# Patient Record
Sex: Female | Born: 2000 | Race: White | Hispanic: No | Marital: Single | Smoking: Never smoker
Health system: Southern US, Community
[De-identification: ages and names within clinical notes are randomized; demographics above are authoritative.]

## PROBLEM LIST (undated history)

## (undated) DIAGNOSIS — G8929 Other chronic pain: Secondary | ICD-10-CM

## (undated) DIAGNOSIS — F32A Depression, unspecified: Secondary | ICD-10-CM

## (undated) DIAGNOSIS — N946 Dysmenorrhea, unspecified: Secondary | ICD-10-CM

## (undated) DIAGNOSIS — R41840 Attention and concentration deficit: Secondary | ICD-10-CM

## (undated) DIAGNOSIS — R519 Headache, unspecified: Secondary | ICD-10-CM

## (undated) DIAGNOSIS — K5904 Chronic idiopathic constipation: Secondary | ICD-10-CM

## (undated) HISTORY — PX: NO PAST SURGERIES: SHX2092

## (undated) HISTORY — DX: Dysmenorrhea, unspecified: N94.6

## (undated) HISTORY — DX: Depression, unspecified: F32.A

## (undated) HISTORY — DX: Headache, unspecified: R51.9

## (undated) HISTORY — DX: Other chronic pain: G89.29

---
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
## 2020-02-05 DIAGNOSIS — R109 Unspecified abdominal pain: Secondary | ICD-10-CM | POA: Insufficient documentation

## 2020-02-05 DIAGNOSIS — F4321 Adjustment disorder with depressed mood: Secondary | ICD-10-CM | POA: Insufficient documentation

## 2020-04-09 ENCOUNTER — Other Ambulatory Visit: Payer: Self-pay | Admitting: Orthopedic Surgery

## 2020-04-09 DIAGNOSIS — M2392 Unspecified internal derangement of left knee: Secondary | ICD-10-CM

## 2020-04-17 ENCOUNTER — Ambulatory Visit
Admission: RE | Admit: 2020-04-17 | Discharge: 2020-04-17 | Disposition: A | Discharge status: Home / Self Care | Payer: BC Managed Care – PPO | Source: Ambulatory Visit | Attending: Orthopedic Surgery | Admitting: Orthopedic Surgery

## 2020-04-17 ENCOUNTER — Other Ambulatory Visit: Payer: Self-pay

## 2020-04-17 DIAGNOSIS — M2392 Unspecified internal derangement of left knee: Secondary | ICD-10-CM | POA: Diagnosis not present

## 2020-04-18 ENCOUNTER — Ambulatory Visit: Admission: RE | Admit: 2020-04-18 | Payer: 59 | Source: Ambulatory Visit

## 2020-05-18 ENCOUNTER — Ambulatory Visit (LOCAL_COMMUNITY_HEALTH_CENTER): Payer: BC Managed Care – PPO

## 2020-05-18 ENCOUNTER — Other Ambulatory Visit: Payer: Self-pay

## 2020-05-18 DIAGNOSIS — Z23 Encounter for immunization: Secondary | ICD-10-CM | POA: Diagnosis not present

## 2020-05-18 NOTE — Progress Notes (Signed)
Pt declines Bexsero.

## 2020-08-02 ENCOUNTER — Ambulatory Visit
Admission: EM | Admit: 2020-08-02 | Discharge: 2020-08-02 | Disposition: A | Discharge status: Home / Self Care | Payer: BC Managed Care – PPO | Attending: Family Medicine | Admitting: Family Medicine

## 2020-08-02 ENCOUNTER — Other Ambulatory Visit: Payer: Self-pay

## 2020-08-02 DIAGNOSIS — Z20822 Contact with and (suspected) exposure to covid-19: Secondary | ICD-10-CM | POA: Insufficient documentation

## 2020-08-02 DIAGNOSIS — J069 Acute upper respiratory infection, unspecified: Secondary | ICD-10-CM | POA: Insufficient documentation

## 2020-08-02 DIAGNOSIS — R059 Cough, unspecified: Secondary | ICD-10-CM | POA: Diagnosis present

## 2020-08-02 DIAGNOSIS — R0982 Postnasal drip: Secondary | ICD-10-CM | POA: Insufficient documentation

## 2020-08-02 DIAGNOSIS — Z79899 Other long term (current) drug therapy: Secondary | ICD-10-CM | POA: Insufficient documentation

## 2020-08-02 DIAGNOSIS — J029 Acute pharyngitis, unspecified: Secondary | ICD-10-CM | POA: Insufficient documentation

## 2020-08-02 DIAGNOSIS — R062 Wheezing: Secondary | ICD-10-CM | POA: Diagnosis not present

## 2020-08-02 DIAGNOSIS — R0602 Shortness of breath: Secondary | ICD-10-CM | POA: Insufficient documentation

## 2020-08-02 LAB — SARS CORONAVIRUS 2 (TAT 6-24 HRS): SARS Coronavirus 2: NEGATIVE

## 2020-08-02 MED ORDER — BENZONATATE 100 MG PO CAPS: 200.0000 mg | ORAL_CAPSULE | Freq: Three times a day (TID) | ORAL | 0 refills | Status: DC

## 2020-08-02 MED ORDER — PROMETHAZINE-DM 6.25-15 MG/5ML PO SYRP: 5.0000 mL | ORAL_SOLUTION | Freq: Four times a day (QID) | ORAL | 0 refills | Status: DC | PRN

## 2020-08-02 NOTE — ED Provider Notes (Signed)
[facility]    CSN: [id number] Arrival date & time: 08/02/20  1038      History   Chief Complaint Chief Complaint  Patient presents with   Cough    HPI Vanessa Gonzalez is a 19 y.o. female.   19 year old female here for evaluation of cough, congestion, runny nose with clear discharge, sore throat, headache, shortness of breath, and wheezing x4 days.  She reports that her cough is dry and nonproductive.  She denies fever, changes to sense of taste or smell, nausea, vomiting, diarrhea, or body aches.  She denies any known Covid contacts.  She has been fully vaccinated with the [employer] series for Covid.     Past Medical History:  Diagnosis Date   Painful menstrual periods     There are no problems to display for this patient.   Past Surgical History:  Procedure Laterality Date   NO PAST SURGERIES      OB History    Gravida  0   Para  0   Term  0   Preterm  0   AB  0   Living  0     SAB  0   TAB  0   Ectopic  0   Multiple  0   Live Births  0            Home Medications    Prior to Admission medications   Medication Sig Start Date End Date Taking? Authorizing Provider  FLUoxetine (PROZAC) 20 MG capsule Take 20 mg by mouth daily. 07/09/20  Yes [provider]  medroxyPROGESTERone (DEPO-PROVERA) 150 MG/ML injection Inject 1 mL (150 mg total) into the muscle every 3 (three) months. 12/26/18  Yes Shambley, Melody N, CNM  benzonatate (TESSALON) 100 MG capsule Take 2 capsules (200 mg total) by mouth every 8 (eight) hours. 08/02/20   Becky Augusta, NP  promethazine-dextromethorphan (PROMETHAZINE-DM) 6.25-15 MG/5ML syrup Take 5 mLs by mouth 4 (four) times daily as needed for cough. 08/02/20   Becky Augusta, NP    Family History Family History  Problem Relation Age of Onset   Healthy Mother    Healthy Father    Breast cancer Neg Hx    Ovarian cancer Neg Hx    Colon cancer Neg Hx     Social History Social History    Tobacco Use   Smoking status: Never Smoker   Smokeless tobacco: Never Used  Vaping Use   Vaping Use: Never used  Substance Use Topics   Alcohol use: No   Drug use: No     Allergies   Patient has no known allergies.   Review of Systems Review of Systems  Constitutional: Negative for activity change and appetite change.  HENT: Positive for congestion, postnasal drip, rhinorrhea and sore throat. Negative for ear pain, sinus pressure and sinus pain.   Respiratory: Positive for cough, shortness of breath and wheezing.   Cardiovascular: Negative for chest pain.  Gastrointestinal: Negative for abdominal pain, diarrhea and nausea.  Genitourinary: Negative for dysuria and frequency.  Musculoskeletal: Negative for arthralgias.  Skin: Negative for rash.  Neurological: Positive for headaches.  Psychiatric/Behavioral: Negative.      Physical Exam Triage Vital Signs ED Triage Vitals  Enc Vitals Group     BP 08/02/20 1208 118/70     Pulse Rate 08/02/20 1208 70     Resp 08/02/20 1208 17     Temp 08/02/20 1208 98.1 F (36.7 C)     Temp Source  08/02/20 1208 Oral     SpO2 08/02/20 1208 100 %     Weight 08/02/20 1206 155 lb (70.3 kg)     Height 08/02/20 1206 5\' 2"  (1.575 m)     Head Circumference --      Peak Flow --      Pain Score 08/02/20 1206 8     Pain Loc --      Pain Edu? --      Excl. in GC? --    No data found.  Updated Vital Signs BP 118/70 (BP Location: Right Arm)    Pulse 70    Temp 98.1 F (36.7 C) (Oral)    Resp 17    Ht 5\' 2"  (1.575 m)    Wt 155 lb (70.3 kg)    SpO2 100%    BMI 28.35 kg/m   Visual Acuity Right Eye Distance:   Left Eye Distance:   Bilateral Distance:    Right Eye Near:   Left Eye Near:    Bilateral Near:     Physical Exam Vitals and nursing note reviewed.  Constitutional:      General: She is not in acute distress.    Appearance: Normal appearance. She is normal weight. She is not ill-appearing.  HENT:     Head: Normocephalic  and atraumatic.     Right Ear: Tympanic membrane, ear canal and external ear normal.     Left Ear: Tympanic membrane and external ear normal.     Nose: Congestion and rhinorrhea present.     Comments: Nasal mucosa has erythema and edema with moderate clear nasal discharge.    Mouth/Throat:     Mouth: Mucous membranes are moist.     Pharynx: Oropharynx is clear. Posterior oropharyngeal erythema present. No oropharyngeal exudate.     Comments: Tonsillar pillars are unremarkable without redness or exudate.  Posterior oropharynx demonstrates mild erythema with clear postnasal drip. Eyes:     General: No scleral icterus.    Extraocular Movements: Extraocular movements intact.     Conjunctiva/sclera: Conjunctivae normal.     Pupils: Pupils are equal, round, and reactive to light.  Neck:     Comments: Shotty and tender anterior cervical lymphadenopathy. Cardiovascular:     Rate and Rhythm: Normal rate and regular rhythm.     Pulses: Normal pulses.     Heart sounds: Normal heart sounds. No murmur heard.  No gallop.   Pulmonary:     Effort: Pulmonary effort is normal.     Breath sounds: Normal breath sounds. No wheezing, rhonchi or rales.  Musculoskeletal:        General: No swelling or tenderness. Normal range of motion.     Cervical back: Normal range of motion and neck supple.  Lymphadenopathy:     Cervical: Cervical adenopathy present.  Skin:    General: Skin is warm and dry.     Capillary Refill: Capillary refill takes less than 2 seconds.     Findings: No erythema or rash.  Neurological:     General: No focal deficit present.     Mental Status: She is alert and oriented to person, place, and time.  Psychiatric:        Mood and Affect: Mood normal.        Behavior: Behavior normal.        Judgment: Judgment normal.      UC Treatments / Results  Labs (all labs ordered are listed, but only abnormal results are displayed) Labs Reviewed  SARS CORONAVIRUS 2 (TAT 6-24 HRS)     EKG   Radiology No results found.  Procedures Procedures (including critical care time)  Medications Ordered in UC Medications - No data to display  Initial Impression / Assessment and Plan / UC Course  I have reviewed the triage vital signs and the nursing notes.  Pertinent labs & imaging results that were available during my care of the patient were reviewed by me and considered in my medical decision making (see chart for details).   Patient presents for cough congestion runny nose and headache x4 days.  Her symptoms are consistent with viral URI.  Physical exam reveals edematous and erythematous nasal mucosa with moderate clear discharge.  Bilateral TMs clear, posterior oropharynx is red with clear PND.  Lung sounds clear to auscultation.  Will check for Covid and DC home with supportive care and precautions.  Patient reports that when she has had a cough in the past Tessalon Perles worked for her.  Will prescribe those.   Final Clinical Impressions(s) / UC Diagnoses   Final diagnoses:  Viral URI with cough     Discharge Instructions     Isolate until the results of your Covid test are back.  If the test is positive you will need to quarantine for 10 days from the start of your symptoms.  After 10 days you can break quarantine if your symptoms have improved and you have not had a fever in 24 hours.  Use over-the-counter Tylenol and ibuprofen as needed for fever and pain.  Gargle with warm salt water 2-3 times a day to help with throat soreness and wash away postnasal drip.  Use the Tessalon Perles every 8 hours as needed for cough and the Promethazine DM at bedtime.  Return for new or worsening symptoms.    ED Prescriptions    Medication Sig Dispense Auth. Provider   benzonatate (TESSALON) 100 MG capsule Take 2 capsules (200 mg total) by mouth every 8 (eight) hours. 21 capsule Becky Augusta, NP   promethazine-dextromethorphan (PROMETHAZINE-DM) 6.25-15 MG/5ML  syrup Take 5 mLs by mouth 4 (four) times daily as needed for cough. 118 mL Becky Augusta, NP     PDMP not reviewed this encounter.   Becky Augusta, NP 08/02/20 1247

## 2020-08-02 NOTE — Discharge Instructions (Addendum)
Isolate until the results of your Covid test are back.  If the test is positive you will need to quarantine for 10 days from the start of your symptoms.  After 10 days you can break quarantine if your symptoms have improved and you have not had a fever in 24 hours.  Use over-the-counter Tylenol and ibuprofen as needed for fever and pain.  Gargle with warm salt water 2-3 times a day to help with throat soreness and wash away postnasal drip.  Use the Tessalon Perles every 8 hours as needed for cough and the Promethazine DM at bedtime.  Return for new or worsening symptoms.

## 2020-08-02 NOTE — ED Triage Notes (Signed)
Patient complains of cough, congestion, runny nose, headache x Thursday.

## 2020-09-28 ENCOUNTER — Ambulatory Visit
Admission: RE | Admit: 2020-09-28 | Discharge: 2020-09-28 | Disposition: A | Discharge status: Home / Self Care | Payer: BC Managed Care – PPO | Source: Ambulatory Visit | Attending: Emergency Medicine | Admitting: Emergency Medicine

## 2020-09-28 ENCOUNTER — Other Ambulatory Visit: Payer: Self-pay

## 2020-09-28 VITALS — BP 110/66 | HR 89 | Temp 98.3°F | Resp 18

## 2020-09-28 DIAGNOSIS — J069 Acute upper respiratory infection, unspecified: Secondary | ICD-10-CM | POA: Diagnosis not present

## 2020-09-28 DIAGNOSIS — Z20822 Contact with and (suspected) exposure to covid-19: Secondary | ICD-10-CM | POA: Diagnosis not present

## 2020-09-28 LAB — RESP PANEL BY RT-PCR (FLU A&B, COVID) ARPGX2
Influenza A by PCR: NEGATIVE
Influenza B by PCR: NEGATIVE
SARS Coronavirus 2 by RT PCR: NEGATIVE

## 2020-09-28 NOTE — ED Triage Notes (Signed)
Pt presents with nasal drainage, non productive cough, and generalized body aches X 2 days.

## 2020-09-28 NOTE — ED Provider Notes (Signed)
[facility]    CSN: [id number] Arrival date & time: 09/28/20  0900      History   Chief Complaint Chief Complaint  Patient presents with  . Cough  . URI    HPI Vanessa Gonzalez is a 19 y.o. female.   19 year old female presents with sore throat, nasal congestion, body aches and cough that started 2 days ago. Also having headache and decreased appetite. No distinct fever or GI symptoms. Has taken Excedrin for headache with some relief. No known exposure to COVID 19. Has been vaccinated against COVID. No other chronic health issues. Takes Prozac daily and on DepoProvera every 3 months.   The history is provided by the patient.    Past Medical History:  Diagnosis Date  . Painful menstrual periods     There are no problems to display for this patient.   Past Surgical History:  Procedure Laterality Date  . NO PAST SURGERIES      OB History    Gravida  0   Para  0   Term  0   Preterm  0   AB  0   Living  0     SAB  0   IAB  0   Ectopic  0   Multiple  0   Live Births  0            Home Medications    Prior to Admission medications   Medication Sig Start Date End Date Taking? Authorizing Provider  FLUoxetine (PROZAC) 20 MG capsule Take 20 mg by mouth daily. 07/09/20   [provider]  medroxyPROGESTERone (DEPO-PROVERA) 150 MG/ML injection Inject 1 mL (150 mg total) into the muscle every 3 (three) months. 12/26/18   Purcell Nails, CNM    Family History Family History  Problem Relation Age of Onset  . Healthy Mother   . Healthy Father   . Breast cancer Neg Hx   . Ovarian cancer Neg Hx   . Colon cancer Neg Hx     Social History Social History   Tobacco Use  . Smoking status: Never Smoker  . Smokeless tobacco: Never Used  Vaping Use  . Vaping Use: Never used  Substance Use Topics  . Alcohol use: No  . Drug use: No     Allergies   Patient has no known allergies.   Review of Systems Review of Systems   Constitutional: Positive for appetite change and fatigue. Negative for chills and fever.  HENT: Positive for congestion, postnasal drip, rhinorrhea, sinus pressure and sore throat. Negative for ear discharge, ear pain, facial swelling, mouth sores, nosebleeds, sinus pain and trouble swallowing.   Eyes: Negative for pain, discharge, redness and itching.  Respiratory: Positive for cough. Negative for chest tightness, shortness of breath and wheezing.   Gastrointestinal: Negative for diarrhea, nausea and vomiting.  Musculoskeletal: Positive for arthralgias and myalgias. Negative for neck pain and neck stiffness.  Skin: Negative for color change and rash.  Allergic/Immunologic: Negative for environmental allergies, food allergies and immunocompromised state.  Neurological: Positive for headaches. Negative for dizziness, seizures, syncope, weakness, light-headedness and numbness.  Hematological: Negative for adenopathy. Does not bruise/bleed easily.     Physical Exam Triage Vital Signs ED Triage Vitals  Enc Vitals Group     BP 09/28/20 0918 110/66     Pulse Rate 09/28/20 0918 89     Resp 09/28/20 0918 18     Temp 09/28/20 0918 98.3 F (36.8 C)  Temp Source 09/28/20 0918 Oral     SpO2 09/28/20 0918 98 %     Weight --      Height --      Head Circumference --      Peak Flow --      Pain Score 09/28/20 0917 5     Pain Loc --      Pain Edu? --      Excl. in GC? --    No data found.  Updated Vital Signs BP 110/66 (BP Location: Left Arm)   Pulse 89   Temp 98.3 F (36.8 C) (Oral)   Resp 18   SpO2 98%   Visual Acuity Right Eye Distance:   Left Eye Distance:   Bilateral Distance:    Right Eye Near:   Left Eye Near:    Bilateral Near:     Physical Exam Vitals and nursing note reviewed.  Constitutional:      General: She is awake. She is not in acute distress.    Appearance: She is well-developed and well-groomed.     Comments: She is sitting on the exam table in no  acute distress but appears tired.   HENT:     Head: Normocephalic and atraumatic.     Right Ear: Hearing, ear canal and external ear normal. Tympanic membrane is bulging. Tympanic membrane is not injected or erythematous.     Left Ear: Hearing, ear canal and external ear normal. Tympanic membrane is bulging. Tympanic membrane is not injected or erythematous.     Nose: Congestion present.     Right Sinus: No maxillary sinus tenderness or frontal sinus tenderness.     Left Sinus: No maxillary sinus tenderness or frontal sinus tenderness.     Mouth/Throat:     Lips: Pink.     Mouth: Mucous membranes are moist.     Pharynx: Uvula midline. Posterior oropharyngeal erythema present. No pharyngeal swelling, oropharyngeal exudate or uvula swelling.  Eyes:     Extraocular Movements: Extraocular movements intact.     Conjunctiva/sclera: Conjunctivae normal.  Cardiovascular:     Rate and Rhythm: Normal rate and regular rhythm.     Heart sounds: Normal heart sounds. No murmur heard.   Pulmonary:     Effort: Pulmonary effort is normal. No respiratory distress.     Breath sounds: Normal breath sounds and air entry. No decreased air movement. No decreased breath sounds, wheezing or rhonchi.  Musculoskeletal:     Cervical back: Normal range of motion and neck supple. No rigidity.  Lymphadenopathy:     Cervical: No cervical adenopathy.  Skin:    General: Skin is warm and dry.     Capillary Refill: Capillary refill takes less than 2 seconds.     Findings: No rash.  Neurological:     General: No focal deficit present.     Mental Status: She is alert and oriented to person, place, and time.  Psychiatric:        Mood and Affect: Mood normal.        Behavior: Behavior normal. Behavior is cooperative.        Thought Content: Thought content normal.        Judgment: Judgment normal.      UC Treatments / Results  Labs (all labs ordered are listed, but only abnormal results are displayed) Labs  Reviewed  RESP PANEL BY RT-PCR (FLU A&B, COVID) [id number]    EKG   Radiology No results found.  Procedures Procedures (including critical  care time)  Medications Ordered in UC Medications - No data to display  Initial Impression / Assessment and Plan / UC Course  I have reviewed the triage vital signs and the nursing notes.  Pertinent labs & imaging results that were available during my care of the patient were reviewed by me and considered in my medical decision making (see chart for details).     Reviewed negative rapid COVID 19 test and Influenza test results with patient. Discussed that she probably has a viral upper respiratory illness. May continue Excedrin as needed for headache. May take OTC Dayquil and Nyquil or similar medication for symptom relief. Continue to push fluids to stay well hydrated. Rest. Note written for work. Follow-up in 4 to 5 days if not improving.   Final Clinical Impressions(s) / UC Diagnoses   Final diagnoses:  Viral URI with cough     Discharge Instructions     Recommend continue Excedrin as needed for headache. May take OTC cold and flu medication as needed for symptoms. Rest. Push fluids to help stay hydrated. Follow-up in 4 to 5 days if not improving.     ED Prescriptions    None     PDMP not reviewed this encounter.   Sudie Grumbling, NP 09/28/20 [phone]

## 2020-09-28 NOTE — Discharge Instructions (Addendum)
Recommend continue Excedrin as needed for headache. May take OTC cold and flu medication as needed for symptoms. Rest. Push fluids to help stay hydrated. Follow-up in 4 to 5 days if not improving.

## 2020-10-05 ENCOUNTER — Ambulatory Visit
Admission: EM | Admit: 2020-10-05 | Discharge: 2020-10-05 | Disposition: A | Discharge status: Home / Self Care | Payer: BC Managed Care – PPO | Attending: Family Medicine | Admitting: Family Medicine

## 2020-10-05 ENCOUNTER — Other Ambulatory Visit: Payer: Self-pay

## 2020-10-05 ENCOUNTER — Encounter: Payer: Self-pay | Admitting: Emergency Medicine

## 2020-10-05 DIAGNOSIS — R42 Dizziness and giddiness: Secondary | ICD-10-CM

## 2020-10-05 MED ORDER — MECLIZINE HCL 25 MG PO TABS: 25.0000 mg | ORAL_TABLET | Freq: Three times a day (TID) | ORAL | 0 refills | Status: DC | PRN

## 2020-10-05 NOTE — ED Triage Notes (Signed)
Pt c/o dizziness. She states she feels like the room is spinning and is worse when she changes positions. Started this morning. She denies nausea or vomiting.

## 2020-10-05 NOTE — ED Provider Notes (Signed)
[facility]    CSN: [id number] Arrival date & time: 10/05/20  1038      History   Chief Complaint Chief Complaint  Patient presents with   Dizziness   HPI  19 year old female presents with dizziness.  Started abruptly this morning.  Started while she was in bed.  States that she feels like the room is spinning.  She denies any associated nausea or vomiting.  Worse with abrupt head movements.  She states that she does not feel well enough to drive or well enough to work today.  No relieving factors.  No other associated symptoms.  No other complaints.  Past Medical History:  Diagnosis Date   Painful menstrual periods    Past Surgical History:  Procedure Laterality Date   NO PAST SURGERIES      OB History    Gravida  0   Para  0   Term  0   Preterm  0   AB  0   Living  0     SAB  0   IAB  0   Ectopic  0   Multiple  0   Live Births  0            Home Medications    Prior to Admission medications   Medication Sig Start Date End Date Taking? Authorizing Provider  FLUoxetine (PROZAC) 20 MG capsule Take 20 mg by mouth daily. 07/09/20  Yes [provider]  medroxyPROGESTERone (DEPO-PROVERA) 150 MG/ML injection Inject 1 mL (150 mg total) into the muscle every 3 (three) months. 12/26/18  Yes Shambley, Melody N, CNM  meclizine (ANTIVERT) 25 MG tablet Take 1 tablet (25 mg total) by mouth 3 (three) times daily as needed for dizziness. 10/05/20   Tommie Sams, DO    Family History Family History  Problem Relation Age of Onset   Healthy Mother    Healthy Father    Breast cancer Neg Hx    Ovarian cancer Neg Hx    Colon cancer Neg Hx     Social History Social History   Tobacco Use   Smoking status: Never Smoker   Smokeless tobacco: Never Used  Vaping Use   Vaping Use: Never used  Substance Use Topics   Alcohol use: No   Drug use: No     Allergies   Patient has no known allergies.   Review of  Systems Review of Systems  Gastrointestinal: Negative for nausea and vomiting.  Neurological: Positive for dizziness.   Physical Exam Triage Vital Signs ED Triage Vitals  Enc Vitals Group     BP 10/05/20 1217 (!) 134/93     Pulse Rate 10/05/20 1217 78     Resp 10/05/20 1217 18     Temp 10/05/20 1217 98.4 F (36.9 C)     Temp Source 10/05/20 1217 Oral     SpO2 10/05/20 1217 100 %     Weight 10/05/20 1216 154 lb 15.7 oz (70.3 kg)     Height 10/05/20 1216 5\' 2"  (1.575 m)     Head Circumference --      Peak Flow --      Pain Score 10/05/20 1216 0     Pain Loc --      Pain Edu? --      Excl. in GC? --    No data found.  Updated Vital Signs BP (!) 134/93 (BP Location: Right Arm)    Pulse 78    Temp 98.4 F (  36.9 C) (Oral)    Resp 18    Ht 5\' 2"  (1.575 m)    Wt 70.3 kg    SpO2 100%    BMI 28.35 kg/m   Visual Acuity Right Eye Distance:   Left Eye Distance:   Bilateral Distance:    Right Eye Near:   Left Eye Near:    Bilateral Near:     Physical Exam Constitutional:      General: She is not in acute distress.    Appearance: Normal appearance. She is not ill-appearing.  HENT:     Head: Normocephalic and atraumatic.  Eyes:     Comments: Nystagmus noted with left lateral gaze.  Cardiovascular:     Rate and Rhythm: Normal rate and regular rhythm.     Heart sounds: No murmur heard.   Pulmonary:     Effort: Pulmonary effort is normal.     Breath sounds: Normal breath sounds. No wheezing, rhonchi or rales.  Neurological:     Mental Status: She is alert.  Psychiatric:        Mood and Affect: Mood normal.        Behavior: Behavior normal.    UC Treatments / Results  Labs (all labs ordered are listed, but only abnormal results are displayed) Labs Reviewed - No data to display  EKG   Radiology No results found.  Procedures Procedures (including critical care time)  Medications Ordered in UC Medications - No data to display  Initial Impression / Assessment  and Plan / UC Course  I have reviewed the triage vital signs and the nursing notes.  Pertinent labs & imaging results that were available during my care of the patient were reviewed by me and considered in my medical decision making (see chart for details).    19 year old female presents with BPPV.  Treating with meclizine.  Final Clinical Impressions(s) / UC Diagnoses   Final diagnoses:  Vertigo   Discharge Instructions   None    ED Prescriptions    Medication Sig Dispense Auth. Provider   meclizine (ANTIVERT) 25 MG tablet Take 1 tablet (25 mg total) by mouth 3 (three) times daily as needed for dizziness. 30 tablet 12, DO     PDMP not reviewed this encounter.   Tommie Sams, Tommie Sams 10/05/20 1338

## 2020-10-20 ENCOUNTER — Ambulatory Visit
Admission: EM | Admit: 2020-10-20 | Discharge: 2020-10-20 | Disposition: A | Discharge status: Home / Self Care | Payer: BC Managed Care – PPO | Attending: Sports Medicine | Admitting: Sports Medicine

## 2020-10-20 ENCOUNTER — Other Ambulatory Visit: Payer: Self-pay

## 2020-10-20 ENCOUNTER — Encounter: Payer: Self-pay | Admitting: Emergency Medicine

## 2020-10-20 DIAGNOSIS — Z20822 Contact with and (suspected) exposure to covid-19: Secondary | ICD-10-CM | POA: Insufficient documentation

## 2020-10-20 LAB — SARS CORONAVIRUS 2 (TAT 6-24 HRS): SARS Coronavirus 2: NEGATIVE

## 2020-10-20 NOTE — Discharge Instructions (Signed)

## 2020-10-20 NOTE — ED Triage Notes (Signed)
Patient here for COVID test only, no symptoms.  

## 2021-01-26 ENCOUNTER — Other Ambulatory Visit: Payer: Self-pay

## 2021-01-26 ENCOUNTER — Ambulatory Visit (INDEPENDENT_AMBULATORY_CARE_PROVIDER_SITE_OTHER): Payer: BC Managed Care – PPO

## 2021-01-26 ENCOUNTER — Ambulatory Visit
Admission: EM | Admit: 2021-01-26 | Discharge: 2021-01-26 | Disposition: A | Discharge status: Home / Self Care | Payer: BC Managed Care – PPO | Attending: Emergency Medicine | Admitting: Emergency Medicine

## 2021-01-26 DIAGNOSIS — S99911A Unspecified injury of right ankle, initial encounter: Secondary | ICD-10-CM | POA: Diagnosis not present

## 2021-01-26 DIAGNOSIS — S99921A Unspecified injury of right foot, initial encounter: Secondary | ICD-10-CM

## 2021-01-26 DIAGNOSIS — S93401A Sprain of unspecified ligament of right ankle, initial encounter: Secondary | ICD-10-CM

## 2021-01-26 DIAGNOSIS — X501XXA Overexertion from prolonged static or awkward postures, initial encounter: Secondary | ICD-10-CM | POA: Diagnosis not present

## 2021-01-26 DIAGNOSIS — S93601A Unspecified sprain of right foot, initial encounter: Secondary | ICD-10-CM

## 2021-01-26 MED ORDER — IBUPROFEN 600 MG PO TABS: 600.0000 mg | ORAL_TABLET | Freq: Four times a day (QID) | ORAL | 0 refills | Status: DC | PRN

## 2021-01-26 NOTE — ED Triage Notes (Signed)
Patient states that she rolled her ankle getting off bleachers today. States that her foot rolled under her. Reports that pain is worse with weight bearing. States that this occurred around 11am.

## 2021-01-26 NOTE — ED Provider Notes (Signed)
HPI  SUBJECTIVE:  Vanessa Gonzalez is a 20 y.o. female who presents with throbbing constant right ankle and foot pain after injuring it earlier today.  She states that she was pushing off the bleachers to stand up, was stepping down onto her right foot, and then her right ankle rolled outward.  She felt a pop and then fell to a sitting position onto her ankle and foot.  States that she has difficulty moving her toes, ande is unable to move her ankle due to pain.  She reports numbness and tingling, swelling laterally.  She was unable to weight-bear immediately after.  She is unable to weight-bear now.  No bruising.  She tried ice, elevation, and compressing her distal tibia and fibula together with improvement in her symptoms.  Symptoms worse with trying to weight-bear, movement.  Past medical history negative for right ankle injury.  LMP: Now.  Denies the possibility of being pregnant.  PMD: Dr. Judithann Sheen.  Orthopedics: None    Past Medical History:  Diagnosis Date  . Painful menstrual periods     Past Surgical History:  Procedure Laterality Date  . NO PAST SURGERIES      Family History  Problem Relation Age of Onset  . Healthy Mother   . Healthy Father   . Breast cancer Neg Hx   . Ovarian cancer Neg Hx   . Colon cancer Neg Hx     Social History   Tobacco Use  . Smoking status: Never Smoker  . Smokeless tobacco: Never Used  Vaping Use  . Vaping Use: Never used  Substance Use Topics  . Alcohol use: No  . Drug use: No    No current facility-administered medications for this encounter.  Current Outpatient Medications:  .  FLUoxetine (PROZAC) 20 MG capsule, Take 20 mg by mouth daily., Disp: , Rfl:  .  ibuprofen (ADVIL) 600 MG tablet, Take 1 tablet (600 mg total) by mouth every 6 (six) hours as needed., Disp: 30 tablet, Rfl: 0 .  medroxyPROGESTERone (DEPO-PROVERA) 150 MG/ML injection, Inject 1 mL (150 mg total) into the muscle every 3 (three) months., Disp: 1 mL, Rfl: 4  No Known  Allergies   ROS  As noted in HPI.   Physical Exam  BP (!) 126/91 (BP Location: Left Arm)   Pulse 85   Temp 98.1 F (36.7 C) (Oral)   Resp 18   Ht 5\' 2"  (1.575 m)   Wt 70.3 kg   SpO2 100%   BMI 28.35 kg/m   Constitutional: Well developed, well nourished, no acute distress Eyes:  EOMI, conjunctiva normal bilaterally HENT: Normocephalic, atraumatic,mucus membranes moist Respiratory: Normal inspiratory effort Cardiovascular: Normal rate GI: nondistended skin: No rash, skin intact Musculoskeletal: R  Ankle Tenderness entire joint, Proximal fibula NT , Distal fibula  tender, Medial malleolus tender,  Deltoid ligaments medially  tender,  ATFL  tender, calcaneofibular ligament tender, posterior tablofibular ligament tender ,  Achilles NT, calcaneus NT,  Proximal 5th metatarsal tender, Midfoot tender with mild soft tissue swelling lateral foot immediately below ATFL, distal NVI with baseline sensation / motor to foot with DP 2+. Pain with dorsiflexion/plantar flexion.  pain with inversion/eversion. No bruising. - squeeze test  Ant drawer test stable . Pt not able to bear weight in dept.  Neurologic: Alert & oriented x 3, no focal neuro deficits Psychiatric: Speech and behavior appropriate   ED Course   Medications - No data to display  Orders Placed This Encounter  Procedures  .  DG Ankle Complete Right    Standing Status:   Standing    Number of Occurrences:   1    Order Specific Question:   Reason for Exam (SYMPTOM  OR DIAGNOSIS REQUIRED)    Answer:   s/p fall with rolled motion, pain and swelling  . DG Foot Complete Right    Standing Status:   Standing    Number of Occurrences:   1    Order Specific Question:   Reason for Exam (SYMPTOM  OR DIAGNOSIS REQUIRED)    Answer:   s/p fall with rolled motion, pain and swelling  . Apply ASO ankle    Standing Status:   Standing    Number of Occurrences:   1    Order Specific Question:   Laterality    Answer:   Right  . Crutches     Standing Status:   Standing    Number of Occurrences:   1    No results found for this or any previous visit (from the past 24 hour(s)). DG Ankle Complete Right  Result Date: 01/26/2021 CLINICAL DATA:  Fall with rolling injury EXAM: RIGHT ANKLE - COMPLETE 3+ VIEW COMPARISON:  None. FINDINGS: Frontal, oblique, and lateral views were obtained. No evident fracture or joint effusion. No appreciable joint space narrowing or erosion. Ankle mortise appears intact. IMPRESSION: No fracture or evident arthropathy.  Ankle mortise appears intact. Electronically Signed   By: Bretta Bang III M.D.   On: 01/26/2021 16:14   DG Foot Complete Right  Result Date: 01/26/2021 CLINICAL DATA:  Fall with rolling type injury EXAM: RIGHT FOOT COMPLETE - 3+ VIEW COMPARISON:  None. FINDINGS: Frontal, oblique, and lateral views were obtained. No fracture or dislocation. Joint spaces appear normal. No erosive change. IMPRESSION: No fracture or dislocation.  No appreciable arthropathy. Electronically Signed   By: Bretta Bang III M.D.   On: 01/26/2021 16:15    ED Clinical Impression  1. Sprain of right ankle, unspecified ligament, initial encounter   2. Sprain of right foot, initial encounter      ED Assessment/Plan  [place] Narcotic database reviewed for this patient, and feel that the risk/benefit ratio today is favorable for proceeding with a prescription for controlled substance.  Reviewed imaging independently.  Ankle mortise appears intact.  No fracture, dislocation of the foot or ankle.  Normal foot, ankle. see radiology report for full details.  Patient with a right ankle and foot sprain.  Will place in an ASO, crutches, Tylenol/ibuprofen, ice, elevate above heart is much as possible.  Will write school note for 2 days and work note for 2 days.  Discussed that she may return to work on Saturday, but that she will need to wear the ASO ankle use the crutches.  Follow-up with Dr. Allena Katz at [facility] or  [facility] ASAP.  Discussed  imaging, MDM, treatment plan, and plan for follow-up with patient. patient agrees with plan.   Meds ordered this encounter  Medications  . ibuprofen (ADVIL) 600 MG tablet    Sig: Take 1 tablet (600 mg total) by mouth every 6 (six) hours as needed.    Dispense:  30 tablet    Refill:  0      *This clinic note was created using Scientist, clinical (histocompatibility and immunogenetics). Therefore, there may be occasional mistakes despite careful proofreading.  ?    Domenick Gong, MD 01/26/21 1800

## 2021-01-26 NOTE — Discharge Instructions (Addendum)
600 mg of ibuprofen combined with 1000 mg of Tylenol together 3-4 times a day as needed for pain.  Ice for 10 to 15 minutes for the first 3 days.  You may do this multiple times a day.  Keep your foot and ankle elevated above your heart is much as possible over the next several days.  Wear the ASO and use the crutches.  Please follow-up with orthopedics as soon as he can.  He may need physical therapy.

## 2021-08-05 ENCOUNTER — Other Ambulatory Visit: Payer: Self-pay

## 2021-08-05 ENCOUNTER — Encounter: Payer: Self-pay | Admitting: Psychiatry

## 2021-08-05 ENCOUNTER — Ambulatory Visit (INDEPENDENT_AMBULATORY_CARE_PROVIDER_SITE_OTHER): Payer: BC Managed Care – PPO | Admitting: Psychiatry

## 2021-08-05 VITALS — BP 132/86 | HR 84 | Temp 98.8°F | Wt 172.0 lb

## 2021-08-05 DIAGNOSIS — R41840 Attention and concentration deficit: Secondary | ICD-10-CM | POA: Diagnosis not present

## 2021-08-05 DIAGNOSIS — F39 Unspecified mood [affective] disorder: Secondary | ICD-10-CM | POA: Insufficient documentation

## 2021-08-05 NOTE — Progress Notes (Signed)
Psychiatric Initial Adult Assessment   Patient Identification: Vanessa Gonzalez MRN:  [id number] Date of Evaluation:  08/05/2021 Referral Source: Aram Beecham MD Chief Complaint:   Chief Complaint   Establish Care; ADD    Visit Diagnosis:    ICD-10-CM   1. Episodic mood disorder (HCC)  F39     2. Attention and concentration deficit  R41.840       History of Present Illness:  Vanessa Gonzalez is a 20 year old Caucasian female, employed at [employer], high school graduate, single, lives in [place], has a history of mood swings, attention and focus problems, was evaluated in office today , patient presented to establish care ,was referred by her primary care provider.  Patient as well as mother participated in the evaluation.  Patient today reports she has been struggling with mood swings like irritability, anger issues as well as episodes of being happy since the past several years.  She reports she has been this way all her life.  She reports she could be happy and smiling one minute and the next minute she could snap and get angry.  She reports when she was in pre-k through elementary she would act out in school, got into fights, throwing chairs, being hyperactive.  She however reports that changed in middle school and high school when she would only have irritable mood swings however she was able to manage it better.  She reports she had problems with learning, she was held back in second grade by her mother due to her learning problems.  She may have had testing done in school, she does not believe she was diagnosed with ADHD.  Patient reports concentration and attention problems, forgetting appointments and obligations, inability to keep up with projects especially boring once, easy distractibility, does not pay attention when someone speaks to her directly, making careless mistakes.  She reports she is interested in going to college at [employer] soon.  She is interested in getting  tested for ADHD.  Patient denies any anxiety or depressive symptoms other than excessive sleepiness during weekends.  Patient completed mood disorder questionnaire and does report episodes when she is more self-confident, easy distractibility, having more energy however did not score high.  She could not give any clear history of manic or hypomanic symptoms.  Patient denies any suicidality, homicidality or perceptual disturbances.  Patient denies any substance abuse problems.  She has good support system from her family, lives with her mother.  She is currently in a relationship with her boyfriend, they just started dating.  Per collateral information from mother-patient has had mood problems, personality issues ever since childhood.  She currently has problems at work, interpersonal problems.  She however has been able to hold her job.  Patient always believes other people are causing problems even when she is the one who may have started it.  Mother believes she likely has learning disability or ADHD and is interested in testing.    Associated Signs/Symptoms: Depression Symptoms:  hypersomnia, difficulty concentrating, loss of energy/fatigue, (Hypo) Manic Symptoms:  Irritable Mood, Anxiety Symptoms:   Denies Psychotic Symptoms:   Denies PTSD Symptoms: Negative  Past Psychiatric History: Patient was diagnosed with depression and anxiety in the past and was started on medications like Prozac-by her OB/GYN, later on changed to venlafaxine, most recently started on Lamictal by her primary care provider.  Patient however is not compliant on any of these medications.  She does not believe any of these medications helped.  Denies inpatient mental health admissions.  Denies suicide attempts.  Previous Psychotropic Medications: Yes as noted above  Substance Abuse History in the last 12 months:  No.  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 Past Medical History:  Diagnosis Date   Chronic headaches    Depression    Painful menstrual periods     Past Surgical History:  Procedure Laterality Date   NO PAST SURGERIES      Family Psychiatric History: As noted below  Family History:  Family History  Problem Relation Age of Onset   Healthy Mother    Mental illness Mother    Bipolar disorder Father    Healthy Father    Breast cancer Neg Hx    Ovarian cancer Neg Hx    Colon cancer Neg Hx     Social History:   Social History   Socioeconomic History   Marital status: Single    Spouse name: Not on file   Number of children: Not on file   Years of education: Not on file   Highest education level: Not on file  Occupational History   Not on file  Tobacco Use   Smoking status: Never   Smokeless tobacco: Never  Vaping Use   Vaping Use: Never used  Substance and Sexual Activity   Alcohol use: No   Drug use: No   Sexual activity: Not Currently    Birth control/protection: Injection  Other Topics Concern   Not on file  Social History Narrative   Works full time for [employer]   Social Determinants of Health   Financial Resource Strain: Not on file  Food Insecurity: Not on file  Transportation Needs: Not on file  Physical Activity: Not on file  Stress: Not on file  Social Connections: Not on file    Additional Social History: Patient was raised by both parents until the age of 71.  Her parents divorced.  She currently lives with her mother and her stepfather.  She currently lives in [place].  Patient has an older brother, younger stepsister.  She has a good relationship with them.  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  Patient has started dating and reports relationship is going okay at this time.  Denies legal problems.  Denies any history of trauma.  Allergies:  No Known  Allergies  Metabolic Disorder Labs: No results found for: HGBA1C, MPG No results found for: PROLACTIN No results found for: CHOL, TRIG, HDL, CHOLHDL, VLDL, LDLCALC No results found for: TSH  Therapeutic Level Labs: No results found for: LITHIUM No results found for: CBMZ No results found for: VALPROATE  Current Medications: Current Outpatient Medications  Medication Sig Dispense Refill   norgestimate-ethinyl estradiol (ORTHO-CYCLEN) 0.25-35 MG-MCG tablet Take 1 tablet by mouth daily.     FLUoxetine (PROZAC) 20 MG capsule Take 20 mg by mouth daily. (Patient not taking: Reported on 08/05/2021)     ibuprofen (ADVIL) 600 MG tablet Take 1 tablet (600 mg total) by mouth every 6 (six) hours as needed. (Patient not taking: Reported on 08/05/2021) 30 tablet 0   lamoTRIgine (LAMICTAL) 150 MG tablet Take 1 tablet by mouth 2 (two) times daily. (Patient not taking: Reported on 08/05/2021)     medroxyPROGESTERone (DEPO-PROVERA) 150 MG/ML injection Inject 1 mL (150 mg total) into the muscle every 3 (three) months. (Patient not taking: Reported on 08/05/2021) 1 mL 4  No current facility-administered medications for this visit.    Musculoskeletal: Strength & Muscle Tone: within normal limits Gait & Station: normal Patient leans: N/A  Psychiatric Specialty Exam: Review of Systems  Neurological:  Positive for headaches (chronic).  Psychiatric/Behavioral:  Positive for decreased concentration.        Mood swings on and off  All other systems reviewed and are negative.  Blood pressure 132/86, pulse 84, temperature 98.8 F (37.1 C), temperature source Temporal, weight 172 lb 0.2 oz (78 kg).Body mass index is 31.46 kg/m.  General Appearance: Casual  Eye Contact:  Fair  Speech:  Clear and Coherent  Volume:  Normal  Mood:   euthymic - but has episodes of being irritable  Affect:  Congruent  Thought Process:  Goal Directed and Descriptions of Associations: Intact  Orientation:  Full (Time,  Place, and Person)  Thought Content:  Logical  Suicidal Thoughts:  No  Homicidal Thoughts:  No  Memory:  Immediate;   Fair Recent;   Fair Remote;   Fair  Judgement:  Fair  Insight:  Fair  Psychomotor Activity:  Normal  Concentration:  Concentration: Fair and Attention Span: Fair  Recall:  [employer] of Knowledge:Fair  Language: Fair  Akathisia:  No  Handed:  Ambidextrous  AIMS (if indicated):  not done  Assets:  Communication Skills Desire for Improvement Housing Intimacy Social Support Talents/Skills Transportation Vocational/Educational  ADL's:  Intact  Cognition: WNL  Sleep:   excessive on weekends   Screenings: GAD-7    Flowsheet Row Office Visit from 08/05/2021 in [facility]  Total GAD-7 Score 2      PHQ2-9    Flowsheet Row Office Visit from 08/05/2021 in [facility]  PHQ-2 Total Score 0  PHQ-9 Total Score 8      Flowsheet Row Office Visit from 08/05/2021 in [facility] ED from 01/26/2021 in [facility] [place]   C-SSRS RISK CATEGORY No Risk No Risk       Assessment and Plan: Vanessa Gonzalez is a 20 year old Caucasian female, single, employed, graduated high school, lives with her mother and stepfather in [place], has a history of mood swings, attention and focus problems was evaluated in office today.  Patient is biologically predisposed given family history of mental health problems.  Patient completed mood disorder questionnaire, scored low on the same, she also does not appear to be clinically depressed and does not meet criteria for an anxiety disorder at this time.  Patient however does report developmental delays-talking-speech as well as attention and focus problems and hyperactivity growing up.  She will benefit from neuropsychological testing. The patient demonstrates the following risk factors for suicide: Chronic risk factors for suicide include:  N/A. Acute risk factors for suicide include:  interpersonal problems . Protective factors for this patient include: positive social support, coping skills, and life satisfaction. Considering these factors, the overall suicide risk at this point appears to be low. Patient is appropriate for outpatient follow up.  Plan Episodic mood disorder-unstable Will refer patient for CBT.  Provided information for insight solution. I have also communicated with staff here to schedule patient with our therapist. Patient was prescribed lamotrigine by primary care provider, she has been noncompliant.  Could restart it if she is interested.  Attention and concentration deficit-unspecified-unstable Will refer for neuropsychological testing-will refer to Dr. Kieth Brightly Completed ASRS -screening. Collateral information obtained from mother. Patient did have hyperactivity and attention problems, developmental delay in  talking/speech.  I have reviewed labs-TSH-1.472-within normal limits-dated 07/12/2021  Follow-up in clinic as needed.  This note was generated in part or whole with voice recognition software. Voice recognition is usually quite accurate but there are transcription errors that can and very often do occur. I apologize for any typographical errors that were not detected and corrected.      Jomarie Longs, MD 10/21/202212:03 PM

## 2021-08-05 NOTE — Patient Instructions (Signed)
CALL [phone] FOR THERAPY   EMAIL - [email or web address]

## 2021-08-15 ENCOUNTER — Encounter: Payer: Self-pay | Admitting: Psychology

## 2021-09-12 ENCOUNTER — Ambulatory Visit: Payer: BC Managed Care – PPO | Admitting: Licensed Clinical Social Worker

## 2021-09-12 ENCOUNTER — Other Ambulatory Visit: Payer: Self-pay

## 2021-09-14 ENCOUNTER — Ambulatory Visit (INDEPENDENT_AMBULATORY_CARE_PROVIDER_SITE_OTHER): Payer: 59 | Admitting: Licensed Clinical Social Worker

## 2021-09-14 ENCOUNTER — Other Ambulatory Visit: Payer: Self-pay

## 2021-09-14 DIAGNOSIS — F39 Unspecified mood [affective] disorder: Secondary | ICD-10-CM

## 2021-09-14 NOTE — Progress Notes (Signed)
Comprehensive Clinical Assessment (CCA) Note  09/14/2021 Vanessa Gonzalez [id number]  Chief Complaint:  Chief Complaint  Patient presents with   Establish Care   Stress   Visit Diagnosis: Episodic Mood Disorder   CCA Screening, Triage and Referral (STR)  Patient Reported Information How did you hear about [place]? Other (Comment)  Referral name: Dr. Shea Evans  Referral phone number: No data recorded  Whom do you see for routine medical problems? No data recorded Practice/Facility Name: No data recorded Practice/Facility Phone Number: No data recorded Name of Contact: No data recorded Contact Number: No data recorded Contact Fax Number: No data recorded Prescriber Name: No data recorded Prescriber Address (if known): No data recorded  What Is the Reason for Your Visit/Call Today? Vanessa Gonzalez is a 20 yo female presenting to [facility] for establishment of counseling services. Pt reports that her mom feels she has needed counseling for quite some time. Pt reports that she had some explosive outbursts as a child, and has episodic mood swings currently. Pt report that most of her stress comes from not getting along with one particular colleague. Pt reporting no SI, HI, or AVH. Pt denies any substance use.  How Long Has This Been Causing You Problems? > than 6 months  What Do You Feel Would Help You the Most Today? Treatment for Depression or other mood problem   Have You Recently Been in Any Inpatient Treatment (Hospital/Detox/Crisis Center/28-Day Program)? No  Name/Location of Program/Hospital:No data recorded How Long Were You There? No data recorded When Were You Discharged? No data recorded  Have You Ever Received Services From [facility] Before? Yes  Who Do You See at [facility]? No data recorded  Have You Recently Had Any Thoughts About Hurting Yourself? No  Are You Planning to Commit Suicide/Harm Yourself At This time? No   Have you Recently Had Thoughts About [place]?  No  Explanation: No data recorded  Have You Used Any Alcohol or Drugs in the Past 24 Hours? No  How Long Ago Did You Use Drugs or Alcohol? No data recorded What Did You Use and How Much? No data recorded  Do You Currently Have a Therapist/Psychiatrist? Yes  Name of Therapist/Psychiatrist: Dr. Shea Evans   Have You Been Recently Discharged From Any Office Practice or Programs? No  Explanation of Discharge From Practice/Program: No data recorded    CCA Screening Triage Referral Assessment Type of Contact: Face-to-Face  Is this Initial or Reassessment? No data recorded Date Telepsych consult ordered in [facility]:  No data recorded Time Telepsych consult ordered in [facility]:  No data recorded  Patient Reported Information Reviewed? No data recorded Patient Left Without Being Seen? No data recorded Reason for Not Completing Assessment: No data recorded  Collateral Involvement: n/a   Does Patient Have a Court Appointed Legal Guardian? No data recorded Name and Contact of Legal Guardian: No data recorded If Minor and Not Living with Parent(s), Who has Custody? No data recorded Is CPS involved or ever been involved? Never  Is APS involved or ever been involved? Never   Patient Determined To Be At Risk for Harm To Self or Others Based on Review of Patient Reported Information or Presenting Complaint? No  Method: No data recorded Availability of Means: No data recorded Intent: No data recorded Notification Required: No data recorded Additional Information for Danger to Others Potential: No data recorded Additional Comments for Danger to Others Potential: No data recorded Are There Guns or Other Weapons in Your Home? No  data recorded Types of Guns/Weapons: No data recorded Are These Weapons Safely Secured?                            No data recorded Who Could Verify You Are Able To Have These Secured: No data recorded Do You Have any Outstanding Charges, Pending Court Dates,  Parole/Probation? No data recorded Contacted To Inform of Risk of Harm To Self or Others: No data recorded  Location of Assessment: Other (comment) ([facility])   Does Patient Present under Involuntary Commitment? No  IVC Papers Initial File Date: No data recorded  [place] of Residence: [place]   Patient Currently Receiving the Following Services: Medication Management   Determination of Need: Routine (7 days)   Options For Referral: Outpatient Therapy; Medication Management     CCA Biopsychosocial Intake/Chief Complaint:  No data recorded Current Symptoms/Problems: anxiety; mood swings   Patient Reported Schizophrenia/Schizoaffective Diagnosis in Past: No   Strengths: pt has good levels of self awareness and self reflection skills  Preferences: outpatient psychiatric supports  Abilities: No data recorded  Type of Services Patient Feels are Needed: medication management; counseling   Initial Clinical Notes/Concerns: Pt reports that she is mostly noncompliant with medication   Mental Health Symptoms Depression:   Sleep (too much or little); Fatigue; Difficulty Concentrating; Irritability   Duration of Depressive symptoms:  Greater than two weeks   Mania:   Racing thoughts; Irritability   Anxiety:    Irritability; Difficulty concentrating; Fatigue; Restlessness; Sleep; Tension   Psychosis:   None   Duration of Psychotic symptoms: No data recorded  Trauma:   None   Obsessions:   None   Compulsions:   None   Inattention:   Poor follow-through on tasks; Symptoms before age 85   Hyperactivity/Impulsivity:   None   Oppositional/Defiant Behaviors:   Argumentative ("more in the past than now")   Emotional Irregularity:   Mood lability   Other Mood/Personality Symptoms:  No data recorded   Mental Status Exam Appearance and self-care  Stature:   Average   Weight:   Average weight   Clothing:   Neat/clean   Grooming:   Normal   Cosmetic  use:   Age appropriate   Posture/gait:   Normal   Motor activity:   Not Remarkable   Sensorium  Attention:   Normal   Concentration:   Normal   Orientation:   X5   Recall/memory:   Normal   Affect and Mood  Affect:   Appropriate   Mood:   Anxious   Relating  Eye contact:   Normal   Facial expression:   Responsive   Attitude toward examiner:   Cooperative   Thought and Language  Speech flow:  Clear and Coherent   Thought content:   Appropriate to Mood and Circumstances   Preoccupation:   None   Hallucinations:   None   Organization:  No data recorded  [employer] of Knowledge:   Good   Intelligence:   Average   Abstraction:   Normal   Judgement:   Good   Reality Testing:   Adequate   Insight:   Good   Decision Making:   Normal   Social Functioning  Social Maturity:   Responsible   Social Judgement:   Normal   Stress  Stressors:   Grief/losses; Work   Coping Ability:   Normal   Skill Deficits:   None   Supports:  Family     Religion: Religion/Spirituality Are You A Religious Person?: No  Leisure/Recreation: Leisure / Recreation Do You Have Hobbies?: Yes Leisure and Hobbies: spending time with boyfriend, shopping, hunting, used to enjoy softball prior to physical injury  Exercise/Diet: Exercise/Diet Do You Exercise?: Yes What Type of Exercise Do You Do?: Run/Walk How Many Times a Week Do You Exercise?: 1-3 times a week Have You Gained or Lost A Significant Amount of Weight in the Past Six Months?: No Do You Follow a Special Diet?: No Do You Have Any Trouble Sleeping?: Yes Explanation of Sleeping Difficulties: "sometimes i sleep too much on the weekends"   CCA Employment/Education Employment/Work Situation: Employment / Work Situation Employment Situation: Employed Where is Patient Currently Employed?: [employer] Satisfied With Your Job?: Yes Do You Work More  Than One Job?: No Work Stressors: "I can't get along with 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?: No  Education: Education Is Patient Currently Attending School?: No Last Grade Completed: 12 Did Garment/textile technologist From [employer]?: Yes Did Theme park manager?: No Did You Attend Graduate School?: No Did You Have Any Difficulty At [employer]?: Yes (pt reports some learning difficulties) Were Any Medications Ever Prescribed For These Difficulties?: No Patient's Education Has Been Impacted by Current Illness: No   CCA Family/Childhood History Family and Relationship History: Family history Does patient have children?: No  Childhood History:  Childhood History By whom was/is the patient raised?: Both parents Additional childhood history information: Parents divorced--pt went back and forth Description of patient's relationship with caregiver when they were a child: stable relationship Patient's description of current relationship with people who raised him/her: stable relationship How were you disciplined when you got in trouble as a child/adolescent?: fairly Does patient have siblings?: Yes Number of Siblings: 2 Description of patient's current relationship with siblings: older brother and younger stepsister. Does not get along with stepsister and gets along with brother when he's around Did patient suffer any verbal/emotional/physical/sexual abuse as a child?: No Did patient suffer from severe childhood neglect?: No Has patient ever been sexually abused/assaulted/raped as an adolescent or adult?: No Was the patient ever a victim of a crime or a disaster?: No Witnessed domestic violence?: No Has patient been affected by domestic violence as an adult?: No  Child/Adolescent Assessment:     CCA Substance Use Alcohol/Drug Use: Alcohol / Drug Use Pain Medications: see MAR Prescriptions: see MAR Over the  Counter: see MAR History of alcohol / drug use?: No history of alcohol / drug abuse     ASAM's:  Six Dimensions of Multidimensional Assessment  Dimension 1:  Acute Intoxication and/or Withdrawal Potential:      Dimension 2:  Biomedical Conditions and Complications:      Dimension 3:  Emotional, Behavioral, or Cognitive Conditions and Complications:     Dimension 4:  Readiness to Change:     Dimension 5:  Relapse, Continued use, or Continued Problem Potential:     Dimension 6:  Recovery/Living Environment:     ASAM Severity Score:    ASAM Recommended Level of Treatment:     Substance use Disorder (SUD)    Recommendations for Services/Supports/Treatments: Recommendations for Services/Supports/Treatments Recommendations For Services/Supports/Treatments: Individual Therapy, Medication Management  DSM5 Diagnoses: Patient Active Problem List   Diagnosis Date Noted   Episodic mood disorder (HCC) 08/05/2021   Attention and concentration deficit 08/05/2021   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

## 2021-09-14 NOTE — Plan of Care (Signed)
Developed tx plan w/ pt input 

## 2021-10-26 ENCOUNTER — Other Ambulatory Visit: Payer: Self-pay

## 2021-10-26 ENCOUNTER — Encounter: Payer: Self-pay | Admitting: Licensed Clinical Social Worker

## 2021-10-26 ENCOUNTER — Ambulatory Visit (INDEPENDENT_AMBULATORY_CARE_PROVIDER_SITE_OTHER): Payer: BC Managed Care – PPO | Admitting: Licensed Clinical Social Worker

## 2021-10-26 DIAGNOSIS — F39 Unspecified mood [affective] disorder: Secondary | ICD-10-CM

## 2021-10-26 NOTE — Progress Notes (Signed)
° °  THERAPIST PROGRESS NOTE  Session Time: 9-945a  Participation Level: Active  Behavioral Response: NeatAlertAnxious  Type of Therapy: Individual Therapy  Treatment Goals addressed:  Problem: Anxiety Disorder CCP Problem  1 Reduce overall frequency, intensity, and duration of the anxiety so that daily functioning is not impaired.   Goal: LTG: Patient will score less than 5 on the Generalized Anxiety Disorder 7 Scale (GAD-7) Outcome: Not Progressing Note: Pt has some increased anxiety after recent job loss  Goal: STG: Patient will participate in at least 80% of scheduled individual psychotherapy sessions Outcome: Progressing  Interventions:  Intervention: Encourage self-care activities Intervention: Encourage family support Intervention: Work with patient to identify the major components of a recent episode of anxiety: physical symptoms, major thoughts and images, and major behaviors they experienced Intervention: Assist with coping skills and behavior   Summary: Vanessa Gonzalez is a 21 y.o. female who presents with symptoms consistent with mood disorder. Pt reports that overall mood has been stable since last session. Pt reports good quality and quantity of sleep with situational insomnia. Pt reports that she is not taking any medication to manage mood.  Allowed pt to explore and express thoughts and feelings associated with recent life situations and external stressors. One major life stressor that has happened since last session is that pt was terminated from her job. Pt states that she called in sick and her supervisor informed her that she would "have to let you go". Pt denies that attendance was an issue in the past--only issue at work was with another employee.  Pt is very unclear as to why she was terminated and has mixed feelings about it. Pt feels well supported by her boyfriend and his family members along with her mother and grandmother. Pt states that she is actively looking  for another job. Offered pt unconditional positive support.  Reviewed coping skills to help manage depression symptoms and anxiety symptoms. Pt reflects understanding.  Continued recommendations are as follows: self care behaviors, positive social engagements, focusing on overall work/home/life balance, and focusing on positive physical and emotional wellness.   Suicidal/Homicidal: No  Therapist Response: Pt is continuing to apply interventions learned in session into daily life situations. Pt is currently on track to meet goals utilizing interventions mentioned above. Personal growth and progress noted. Treatment to continue as indicated.   Plan: Return again in 4 weeks.  Diagnosis: Axis I: Mood Disorder NOS    Axis II: No diagnosis    Vanessa Haber Zailyn Thoennes, LCSW 10/26/2021

## 2021-10-26 NOTE — Plan of Care (Signed)
°  Problem: Anxiety Disorder CCP Problem  1 Reduce overall frequency, intensity, and duration of the anxiety so that daily functioning is not impaired.  Goal: LTG: Patient will score less than 5 on the Generalized Anxiety Disorder 7 Scale (GAD-7) Outcome: Not Progressing Note: Pt has some increased anxiety after recent job loss Goal: STG: Patient will participate in at least 80% of scheduled individual psychotherapy sessions Outcome: Progressing Intervention: Encourage self-care activities Intervention: Encourage family support Intervention: Work with patient to identify the major components of a recent episode of anxiety: physical symptoms, major thoughts and images, and major behaviors they experienced Intervention: Assist with coping skills and behavior

## 2021-11-02 ENCOUNTER — Encounter: Payer: Self-pay | Admitting: Licensed Clinical Social Worker

## 2021-11-30 ENCOUNTER — Ambulatory Visit: Payer: BC Managed Care – PPO | Admitting: Licensed Clinical Social Worker

## 2022-03-02 ENCOUNTER — Encounter: Payer: 59 | Attending: Psychology | Admitting: Psychology

## 2022-03-02 DIAGNOSIS — F39 Unspecified mood [affective] disorder: Secondary | ICD-10-CM | POA: Diagnosis not present

## 2022-03-02 DIAGNOSIS — R41840 Attention and concentration deficit: Secondary | ICD-10-CM | POA: Diagnosis not present

## 2022-03-02 NOTE — Progress Notes (Signed)
Neuropsychological Consultation   Patient:   Vanessa Gonzalez   DOB:   2000-12-11  MR Number:  [id number]  Location:  [facility]ICINE [facility]ILITATION [address] [place], [address] [id number] [facility] [place] [address] Dept: [phone]           Date of Service:   03/02/2022  Start Time:   8 AM End Time:   10 AM  Today's visit was an in person visit conducted in my outpatient clinic office.  The patient, her mother and myself were present.  1 hour and 15 minutes was spent in face-to-face clinical interview and the other 45 minutes was spent with records review, report writing and setting up testing protocols.  Provider/Observer:  Arley Phenix, Psy.D.       Clinical Neuropsychologist       Billing Code/Service: 96116/96121  Chief Complaint:    Vanessa Gonzalez is a 21 year old female referred for neuropsychological evaluation by her treating psychiatrist Jomarie Longs, MD for assistance in differential diagnosis.  The patient has a long history of behavioral difficulties and impulse control issues starting in early childhood with symptoms of depression, agitation and anger and acute mood shifts as well as issues with sleep patterns.  The patient has had acute onset changes in mood.  Difficulties with maintaining attention and focus, uncontrollable mood swings and difficulty sitting still including being very fidgety and impulsive are all noted.  The symptoms go back to early childhood and even preschool.  Reason for Service:  Vanessa Gonzalez is a 21 year old female referred for neuropsychological evaluation by her treating psychiatrist Jomarie Longs, MD for assistance in differential diagnosis.  The patient has a long history of behavioral difficulties and impulse control issues starting in early childhood with symptoms of depression, agitation and anger and acute mood shifts as well as issues with sleep patterns.  The  patient has had acute onset changes in mood.  Difficulties with maintaining attention and focus, uncontrollable mood swings and difficulty sitting still including being very fidgety and impulsive are all noted.  The symptoms go back to early childhood and even preschool.  The patient has a limited past medical history that includes chronic headaches, previous diagnosis of depression and episodic mood disorder, pain associated with menstrual periods and attention and concentration deficits.  The patient and her mother both report that there were significant issues with behavioral difficulties as a child.  The patient would abruptly become very angry when she was a child and would have instances where she would kick adults that she was frustrated with an instance where she threw a chair.  The patient is described as being able to have her mood change "on a dime."  The patient's mother reports that the patient could be in a very good mood and be "good his goal" for others and then if someone would say 1 thing or do something that the patient was frustrated by the patient could flip immediately and get out of control with her behaviors.  This occurred even in daycare.  This sudden mood disturbance was very stressful for the patient's mother.  The patient would regularly fight any type of discipline efforts.  The patient was described as foundation only being very sweet and caring person particularly with others.  The patient was described as having difficulty seeing why her behaviors were wrong.  Even now if the patient does not like what is said the patient's tone suddenly  changes and she will become very snappy and "hateful."  The patient is described as having more sleeping issues in the winter with these issues related to excessive sleep.  The patient reports that she can become very sleepy during the day even though she gets adequate sleep at night.  The patient has been tried on a number of psychotropic  medications in the past.  These have included fluoxetine as well as attempts of stabilization of mood with Lamictal.  However, the patient was never very compliant with her medications.  The patient's mother reports that she can tell a big difference in the patient's behavior and mood when she was taking fluoxetine and felt that it helped her and there was improved behavior when she would take it but the patient would not take it.  When asked of the patient why she was hesitant to taking medications she could not give specifics but did admit to feeling embarrassed that she had to take medicines.  The patient and her mother denies any history of concussive events or loss of consciousness and denies any seizures tremors or other neurological symptoms.  While the patient's mother acknowledges that she has been diagnosed with obsessive-compulsive disorder the patient does not display any symptoms of OCD.  The patient reports that while she feels compulsive to do something at all times and will be very fidgety particularly with her hands it is not described as consistent with obsessive-compulsive or anxiety types of drives.  The patient's mother reports that there have been times such as a break-up with the boyfriend or other stressors whether patient will send very negative text messages about the patient herself.  She will often be very critical of herself and this is worried people such as her past boyfriend for fear that the patient may be harmful to herself.  The patient denies ever having suicidal thoughts or impulse to harm herself.  Mother feels that most of these expressions are seeking attention or acknowledgment of the patient's distress.  The patient's sleep patterns are described as the patient "wanting to sleep all of the time.  She reports that she sleeps well at night but has desires to sleep more than what she feels other people do.  Patient reports that there are times when she has little to no  appetite.  There have been recent psychosocial stressors with the patient's mother and stepfather building a house right now and the patient is moved in with the patient's grandmother during this construction phase.  Patient's mother reports that there are longstanding stressors between the patient and her stepsister and they have been very competitive and resentful of the child there for quite a long time.  Other psychosocial stressors include the death of a family member, loss of relationships and conflicts with others.  The patient's most recent prior job ended up with the patient being let go and the patient feeling like it had to do with conflicts between her and another employee.  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  The patient also has a history of difficulty with developing reading and written language skills in school.  There were concerns that the patient may have dyslexia or some other type of learning disability at some point and it sounds like she was given some psychoeducational testing.  The patient remembers being told that there was not enough findings to diagnose the patient with dyslexia but little other information was  available.   Behavioral Observation: Vanessa Gonzalez  presents as a 21 y.o.-year-old Right handed Caucasian Female who appeared her stated age. her dress was Appropriate and she was Well Groomed and her manners were Appropriate to the situation.  her participation was indicative of Appropriate and Attentive behaviors.  There were not physical disabilities noted.  she displayed an appropriate level of cooperation and motivation.     Interactions:    Active Appropriate  Attention:   within normal limits and attention span and concentration were age appropriate  Memory:   within normal limits; recent and remote memory intact  Visuo-spatial:  not examined  Speech (Volume):  normal  Speech:   normal;  normal  Thought Process:  Coherent and Relevant  Though Content:  WNL; not suicidal and not homicidal  Orientation:   person, place, time/date, and situation  Judgment:   Fair  Planning:   Good  Affect:    Appropriate  Mood:    Anxious  Insight:   Good  Intelligence:   normal  Marital Status/Living: The patient was born and raised in [place] along with 1 biological sibling and has been raised with 1 stepsister.  Developmental milestones were reached at the appropriate time although the patient has had difficulty with development of reading skills.  The patient currently lives with her grandmother as her mother and stepfather are building a new house.  The patient is single and has no children.  Current Employment: The patient is currently working in a daycare and enjoys this job and feels like she is doing well.  Past Employment:  The patient had previously worked for [employer] and was let go from this employment reportedly because she called in sick to work 1 day but feels like he had to do with the relationship with another coworker in a relatively small office setting.  Hobbies and interest have included softball and spending time with kids.  Substance Use:  No concerns of substance abuse are reported.  There are no reports of any specific issues with substance abuse.  Education:   The patient graduated high school with a 2.7 GPA and always did well in math type classes but had some issues with reading in [place].  She did repeat the second grade.  Extracurricular activities included playing softball.  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  Psychiatric History:  The patient does have a long history of agitated behavior and sudden mood changes, attentional issues and behavioral problems.  The patient's biological father was diagnosed with bipolar disorder.  Anxiety and OCD symptoms are present with her mother.  Family Med/Psych History:  Family History  Problem Relation Age of Onset   OCD Mother    Healthy Mother    Mental illness Mother    Bipolar disorder Father    Healthy Father    Breast cancer Neg Hx    Ovarian cancer Neg Hx    Colon cancer Neg  Hx     Impression/DX:  Vanessa Gonzalez is a 21 year old female referred for neuropsychological evaluation by her treating psychiatrist Jomarie Longs, MD for assistance in differential diagnosis.  The patient has a long history of behavioral difficulties and impulse control issues starting in early childhood with symptoms of depression, agitation and anger and acute mood shifts as well as issues with sleep patterns.  The patient has had acute onset changes in mood.  Difficulties with maintaining attention and focus, uncontrollable mood swings and difficulty sitting still including being very fidgety and impulsive are all noted.  The symptoms go back to early childhood and even preschool.  Disposition/Plan:  We have set the patient up for formal neuropsychological testing to facilitate with differential diagnosis and treatment planning.  We will administer the comprehensive attention battery and the CAB CPT measure and the patient will be administered the [place] multiphasic personality inventory as well to look at mood and behavioral issues.  Once this assessment is completed a formal report will be produced with diagnostic considerations and treatment recommendations and made available to her referring physician as well as being available in  the patient's EMR for other members of her care team.  I will also sit down with the patient and per the request and the patient and her mother to go over the results of this assessment.  Diagnosis:    Attention and concentration deficit  Episodic mood disorder (HCC)         Electronically Signed   _______________________ Arley Phenix, Psy.D. Clinical Neuropsychologist

## 2022-03-09 DIAGNOSIS — R41840 Attention and concentration deficit: Secondary | ICD-10-CM | POA: Diagnosis not present

## 2022-03-09 NOTE — Progress Notes (Signed)
   Behavioral Observations The patient appeared well-groomed and appropriately dressed. Her manners were polite and appropriate to the situation. The patient's attitude toward testing was positive and her effort was good.  Neuropsychology Note  Vanessa Gonzalez completed 90 minutes of neuropsychological testing with technician, Vanessa Gonzalez, BA, under the supervision of Ilean Skill, PsyD., Clinical Neuropsychologist. The patient did not appear overtly distressed by the testing session, per behavioral observation or via self-report to the technician. Rest breaks were offered.   Clinical Decision Making: In considering the patient's current level of functioning, level of presumed impairment, nature of symptoms, emotional and behavioral responses during clinical interview, level of literacy, and observed level of motivation/effort, a battery of tests was selected by Dr. Sima Matas during initial consultation on 03/02/2022. This was communicated to the technician. Communication between the neuropsychologist and technician was ongoing throughout the testing session and changes were made as deemed necessary based on patient performance on testing, technician observations and additional pertinent factors such as those listed above.  Tests Administered: Comprehensive Attention Battery (CAB) Continuous Performance Test (CPT)  Results: Will be included in final report   Feedback to Patient: Vanessa Gonzalez will return on 04/24/2022 for an interactive feedback session with Dr. Sima Matas at which time her test performances, clinical impressions and treatment recommendations will be reviewed in detail. The patient understands she can contact our office should she require our assistance before this time.  90 minutes spent face-to-face with patient administering standardized tests, 30 minutes spent scoring Environmental education officer). [CPT [id number], [id number]  Full report to follow.

## 2022-03-15 IMAGING — MR MR KNEE*L* W/O CM
6 series · 40 of 40 positions shown · non-contrast
Comparison: None.

CLINICAL DATA: Left knee pain and locking since an injury skiing in
November 2019. Initial encounter.

EXAM:
MRI OF THE LEFT KNEE WITHOUT CONTRAST
TECHNIQUE: Multiplanar, multisequence MR imaging of the knee was performed. No
intravenous contrast was administered.

[Series 8: T2 fat-sat · axial · left · 4.0mm · 0.50mm/px · z∈[-83,+52]mm · 8 of 28 slices shown (1 of 3)]
[im 1/28]
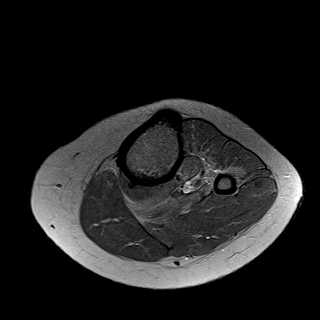
[im 4/28]
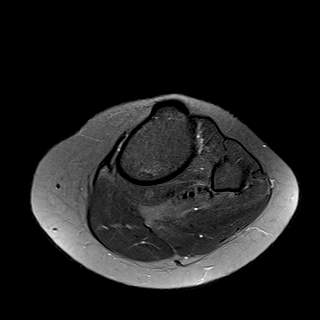
[im 8/28]
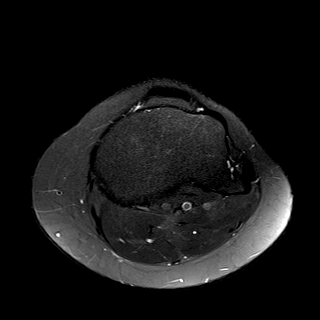
[im 12/28]
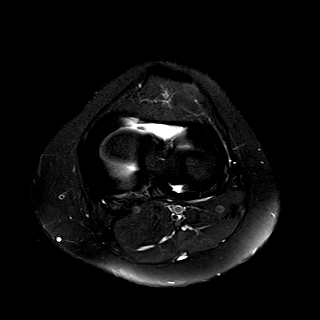
[im 16/28]
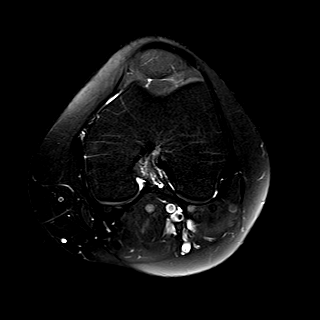
[im 20/28]
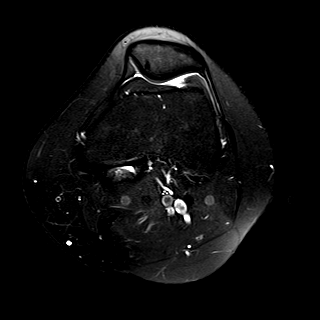
[im 24/28]
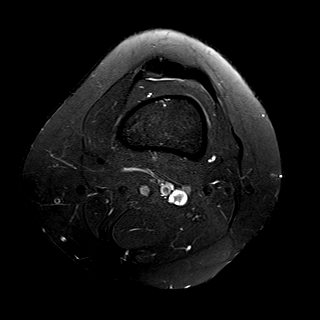
[im 28/28]
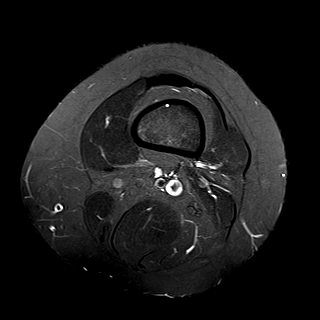

[Series 9: T2 fat-sat · coronal · left · 4.0mm · 0.59mm/px · 6 of 26 slices shown (2 of 3)]
[im 1/26]
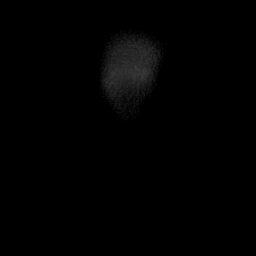
[im 6/26]
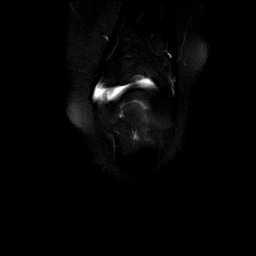
[im 11/26]
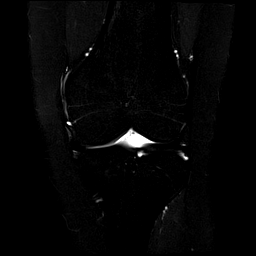
[im 16/26]
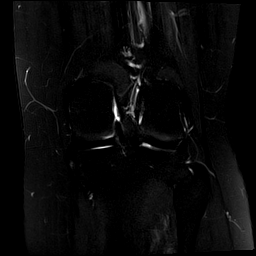
[im 21/26]
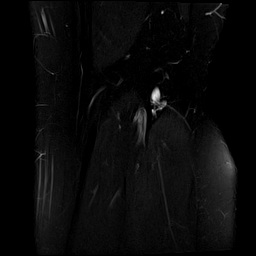
[im 26/26]
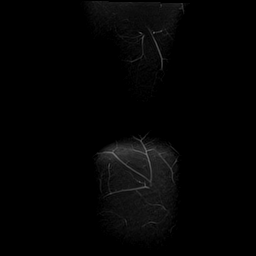

[Series 10: T1 · coronal · left · 4.0mm · 0.59mm/px · 6 of 26 slices shown]
[im 1/26]
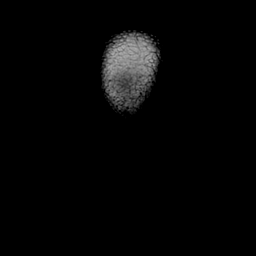
[im 6/26]
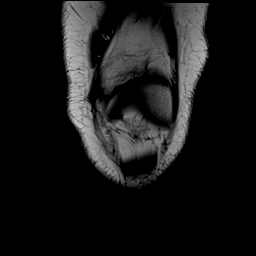
[im 11/26]
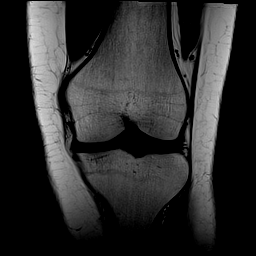
[im 16/26]
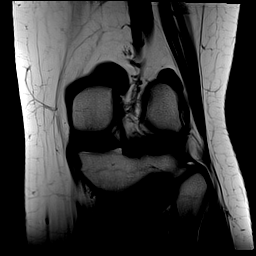
[im 21/26]
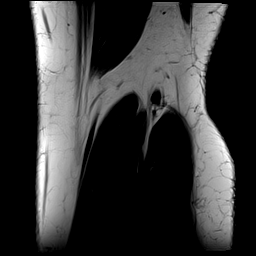
[im 26/26]
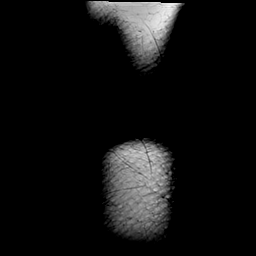

[Series 11: PD fat-sat · coronal · left · 4.0mm · 0.59mm/px · 6 of 26 slices shown (1 of 2)]
[im 1/26]
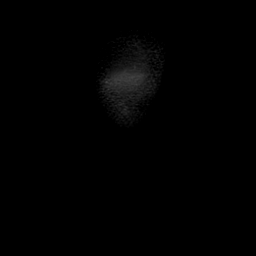
[im 6/26]
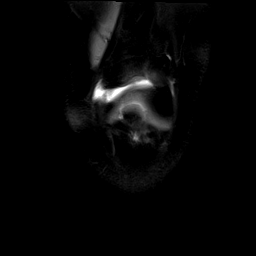
[im 11/26]
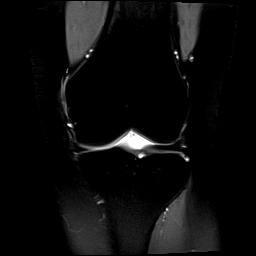
[im 16/26]
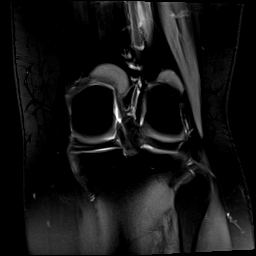
[im 21/26]
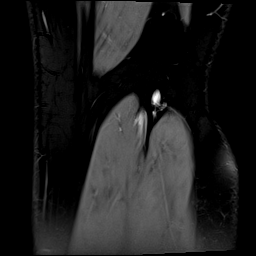
[im 26/26]
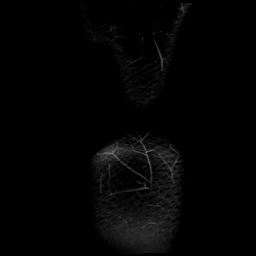

[Series 12: PD fat-sat · sagittal · left · 3.0mm · 0.59mm/px · 7 of 30 slices shown (2 of 2)]
[im 1/30]
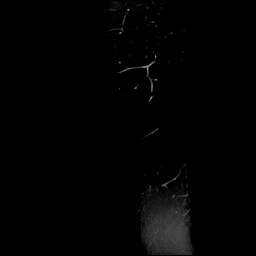
[im 5/30]
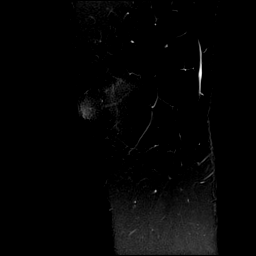
[im 10/30]
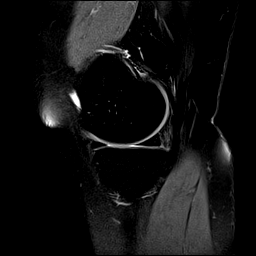
[im 15/30]
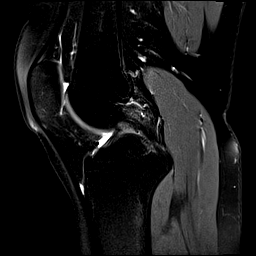
[im 20/30]
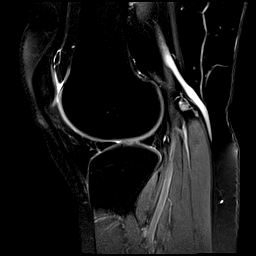
[im 25/30]
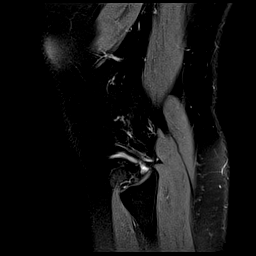
[im 30/30]
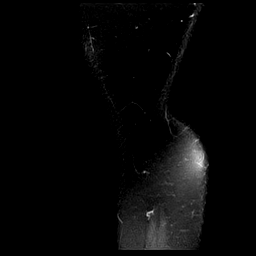

[Series 13: T2 fat-sat · sagittal · left · 3.0mm · 0.59mm/px · 7 of 30 slices shown (3 of 3)]
[im 1/30]
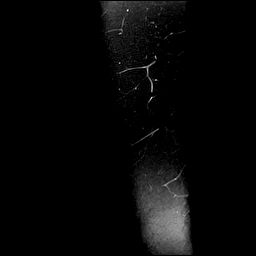
[im 5/30]
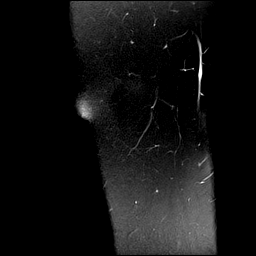
[im 10/30]
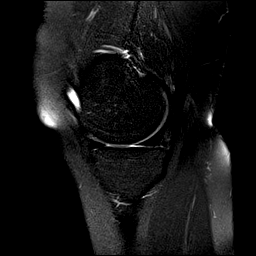
[im 15/30]
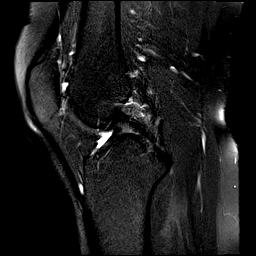
[im 20/30]
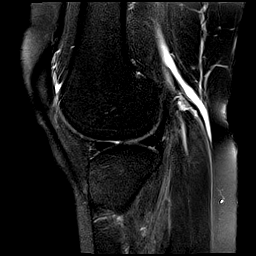
[im 25/30]
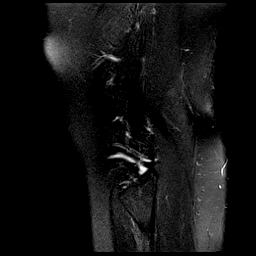
[im 30/30]
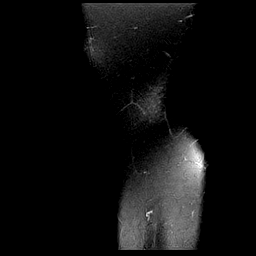

[40 of 40 positions shown; findings below may reference images not displayed]

FINDINGS: MENISCI

Medial meniscus:  Intact.

Lateral meniscus:  Intact.

LIGAMENTS

Cruciates:  Intact.

Collaterals:  Intact.

CARTILAGE

Patellofemoral:  Normal.

Medial:  Normal.

Lateral:  Normal.

Joint:  Trace amount of joint fluid.

Popliteal Fossa:  No Baker's cyst.

Extensor Mechanism:  Intact.

Bones:  Normal marrow signal throughout.

Other: None.
IMPRESSION: Negative MRI left knee.

## 2022-03-27 ENCOUNTER — Other Ambulatory Visit: Payer: Self-pay | Admitting: Family Medicine

## 2022-03-27 ENCOUNTER — Ambulatory Visit
Admission: RE | Admit: 2022-03-27 | Discharge: 2022-03-27 | Disposition: A | Discharge status: Home / Self Care | Payer: 59 | Source: Ambulatory Visit | Attending: Family Medicine | Admitting: Family Medicine

## 2022-03-27 DIAGNOSIS — R1011 Right upper quadrant pain: Secondary | ICD-10-CM

## 2022-03-27 DIAGNOSIS — R1032 Left lower quadrant pain: Secondary | ICD-10-CM | POA: Diagnosis present

## 2022-03-27 MED ORDER — IOHEXOL 300 MG/ML  SOLN
100.0000 mL | Freq: Once | INTRAMUSCULAR | Status: AC | PRN
  Administered 2022-03-27: 100 mL via INTRAVENOUS

## 2022-03-29 ENCOUNTER — Encounter: Payer: Self-pay | Admitting: Psychology

## 2022-03-29 ENCOUNTER — Encounter: Payer: BC Managed Care – PPO | Attending: Psychology | Admitting: Psychology

## 2022-03-29 DIAGNOSIS — F4312 Post-traumatic stress disorder, chronic: Secondary | ICD-10-CM | POA: Insufficient documentation

## 2022-03-29 DIAGNOSIS — F39 Unspecified mood [affective] disorder: Secondary | ICD-10-CM | POA: Diagnosis present

## 2022-03-29 NOTE — Progress Notes (Signed)
Neuropsychological Evaluation   Patient:  Vanessa Gonzalez   DOB: 2001-07-10  MR Number: [id number]  Location: [facility] [place] [facility] [place], [id number] [place] [id number] Dept: [phone]  Start: 10 AM End: 11 AM  Provider/Observer:     Edgardo Roys PsyD  Chief Complaint:      Chief Complaint  Patient presents with   Depression   Agitation   Other    Attention and concentration deficits    Reason For Service:     Vanessa Gonzalez is a 21 year old female referred for neuropsychological evaluation by her treating psychiatrist Ursula Alert, MD for assistance in differential diagnosis.  The patient has a long history of behavioral difficulties and impulse control issues starting in early childhood with symptoms of depression, agitation and anger and acute mood shifts as well as issues with sleep patterns.  The patient has had acute onset changes in mood.  Difficulties with maintaining attention and focus, uncontrollable mood swings and difficulty sitting still including being very fidgety and impulsiveness are all noted.  The symptoms go back to early childhood and even preschool.  The patient has a limited past medical history that includes chronic headaches, previous diagnosis of depression and episodic mood disorder, pain associated with menstrual periods and attention and concentration deficits.  The patient and her mother both report that there were significant issues with behavioral difficulties as a child.  The patient would abruptly become very angry when she was a child and would have instances where she would kick adults that she was frustrated with and an instance where she threw a chair.  The patient is described as being able to have her mood change "on a dime."  The patient's mother reports that the patient could be in a very good mood and be "good his goal" for  others and then if someone would say 1 thing or do something that the patient was frustrated by the patient could flip immediately and get out of control with her behaviors.  This occurred even in daycare.  This sudden mood disturbance was very stressful for the patient's mother.  The patient would regularly fight any type of discipline efforts.  The patient was described as being very sweet and caring person particularly with others.  The patient was described as having difficulty seeing why her behaviors were wrong.  Even now if the patient does not like what is said, the patient's tone suddenly changes and she will become very snappy and "hateful."  The patient is described as having more sleeping issues in the winter with these issues related to excessive sleep.  The patient reports that she can become very sleepy during the day even though she gets adequate sleep at night.  The patient has been tried on a number of psychotropic medications in the past.  These have included fluoxetine as well as attempts of stabilization of mood with Lamictal.  However, the patient was never very compliant with her medications.  The patient's mother reports that she can tell a big difference in the patient's behavior and mood when she was taking fluoxetine and felt that it helped her and there was improved behavior when she would take it but the patient would not take it.  When I asked the patient why she was hesitant to taking medications she could not give specifics but did admit to feeling embarrassed that she had to take medicines.  The patient and her mother denies any history of concussive events or loss of consciousness and denies any seizures tremors or other neurological symptoms.  While the patient's mother acknowledges that she has been diagnosed with obsessive-compulsive disorder, the patient does not display any symptoms of OCD.  The patient reports that while she feels compulsive to do something at all times  and will be very fidgety particularly with her hands it is not described as consistent with obsessive-compulsive or anxiety types of drives.  The patient's mother reports that there have been times such as a break-up with the boyfriend or other stressors where the patient will send very negative text messages about the patient herself.  She will often be very critical of herself and this has worried people such as her past boyfriend for fear that the patient may be harmful to herself.  The patient denies ever having suicidal thoughts or impulse to harm herself.  Mother feels that most of these expressions are seeking attention or acknowledgment of the patient's distress.  The patient's sleep patterns are described as the patient "wanting to sleep all of the time.  She reports that she sleeps well at night but has desires to sleep more than what she feels other people do.  Patient reports that there are times when she has little to no appetite.  There have been recent psychosocial stressors with the patient's mother and stepfather building a house right now and the patient has moved in with the patient's grandmother during this construction phase.  Patient's mother reports that there are longstanding stressors between the patient and her stepsister and they have been very competitive and resentful of of each other for quite a long time.  Other psychosocial stressors include the death of a family member, loss of relationships and conflicts with others.  The patient's most recent prior job ended up with the patient being let go and the patient feeling like it had to do with conflicts between her and another employee.  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  The patient also has a history of difficulty with developing reading and written language skills in school.  There were concerns that the patient may have dyslexia or some other type of learning disability at  some point and it sounds like she was given some psychoeducational testing.  The patient remembers being told that there was not enough findings to diagnose the patient with dyslexia but little other information was available.  Tests Administered: Comprehensive Attention Battery (CAB) Continuous Performance Test (CPT) [place] multiphasic personality inventory-II  Participation Level:   Active  Participation Quality:  Appropriate      Behavioral Observation:  The patient appeared well-groomed and appropriately dressed. Her manners were polite and appropriate to the situation. The patient's attitude toward testing was positive and her effort was good.  Well Groomed, Alert, and Appropriate.   Summary of Results:   The patient completed the [place] multiphasic personality inventory-2 as well as administration of the comprehensive attention battery and the CAB CPT measures in person.  The patient appeared to approach this in an honest and straightforward manner neither attempting to exaggerate or minimize any type of symptomatology.  On the comprehensive attention battery the patient appeared to fully participate in this does appear to be a valid assessment of current attentional variables.  Initially, the patient was administered the auditory/visual pure reaction time test.  The patient had excellent accuracy scores  for both the visual and auditory.  Action time measures and performed within normal limits with only 1 total error of omission on either measure.  The patient's average response time was well within normal limits for both visual and auditory reaction time measures.  The patient showed no deficits with regard to pure reaction time measures.  The patient then was administered the discriminate reaction time measures.  On the visual discriminate reaction time test the patient correctly responded to 31 of 35 targets with 1 error of commission and 4 errors of omission.  This is generally  within normal limits and I suspect that the air of omissions had to do with her cautiousness and interacting with the user interface which is a touch screen but is pressure sensitive rather than driven by conductivity such as a cell phone.  Her average response time was 413 ms which is within normal limits.  On the auditory discriminate reaction time measure she correctly responded to 33 of 35 targets with no errors of commission and 2 errors of omission.  This is within normal limits.  Her average response time was 686 ms which is within normal limits compared to a normative comparison group.  On the shift discriminate reaction time measure where the patient has to rapidly change between target stimuli the patient correctly responded to 29 of 30 targets with only 1 error of commission and 1 error of omission.  This is within normal limits and in fact better than her comparison reference group.  Her average response time was 610 ms was also within normative expectations.  The patient then completed the auditory/visual scan reaction time measures.  On the visual scan reaction time test she correctly responded to 40 of 40 targets with no errors of omission and no errors of commission.  Her average response time was within normal limits.  On the auditory scan reaction time she also had a perfect accuracy score and her average response time was 1216 ms.  Both of these are within normative expectations.  On the mixed scan reaction time which also required shifting attention the patient had perfect accuracy scores and her average response time of 1046 ms was well within normative expectations.  Only auditory/visual encoding measures the patient initially had some difficulty with the auditory forwards task where she was below normative expectations.  She did improve on the auditory backwards measure but showed a similar level of encoding capacity.  The patient did very well on the visual encoding measure both forwards  and backwards.  There was clear difference between her strings with regard to visual encoding and weaknesses with regard to auditory encoding.  Some of these auditory encoding deficits could be related to her struggles academically with significantly better visual encoding versus auditory encoding.  The patient was then administered the Stroop interference cancellation test.  The patient's performance on these measures was within normal limits with the exception of some difficulty initially adjusting to targeted distractors but she quickly regained performance.  On the first 4 9 interference trials the patient performed within normal limits and in fact by the third and fourth 9 interference trials she was exceeding her normative comparisons by roughly 1 standard deviation.  The patient correctly identified 15 of 18 targets on the final 9 interference trial which is an excellent performance.  Her performance dropped down to 8 on the first interference trial but by the second trial she was back to 15 of 18 targets.  This does suggest some  initial difficulty remaining free from external distractors and difficulties with freedom from distractibility.  Finally, the patient was administered the visual monitor CPT test which is a 15-minute long discriminate reaction time measure broken down into five 3-minute blocks of time for analysis.  On the first 3 minutes of this task she correctly identified 30 of 30 targets with 2 errors of commission and no errors of omission.  This is an efficient score and similar to other discriminate reaction time measure performances earlier in this battery.  The patient's average response time was 346 ms which is within normal limits.  The patient maintained quite steady performances throughout the remaining 15-minute task.  She continued to have excellent accuracy scores never having more than 1 or 2 errors of commission or 1 error of omission during any of the 3-minute blocks of time.   Her average response time did slow somewhat and by the final 3 minutes of this 15-minute task she averaged 453 ms.  This is roughly a 100 ms slowing an average response time.  This is very typical and well within normative expectations and does not suggest any slowing of information processing speed beyond what is normal with regard to normative predictions.  Overall, the patient showed very good sustained attention.  The patient was also asked to complete the [place] multiphasic personality inventory-II.  As far as validity scores the patient appeared to approach this in an honest and straightforward manner and did not attempt to exaggerate or minimize any type of symptomatology.  The resulting primary clinical scales do suggest some increased symptoms related to depression and rather vague physical complaints.  She also shows a considerable amount of difficulty with anxiety and difficulty managing moods and thoughts and feelings.  This is consistent with subjective reports.  Further analysis utilizing content scales do indicate some obsessive/intrusive thoughts, anxiety and depressive symptomatology as well as specific health concerns.  However, the patient has had some pain complaints that are being evaluated which may explain some of her increased health concerns.  The patient also endorses a number of items related to cynicism and negative use of the world.  The patient also acknowledges a fair amount of underlying anger and frustration.  Further analysis utilizing supplementary scales highlight this degree of anxiety.  The patient has symptoms consistent with difficulties adjusting to changing social dynamics.  She also has significant elevations on both of the posttraumatic stress disorder scales.  These are significantly elevated.  While there were not any descriptions of particular traumatic experiences in the past the patient did have a lot of behaviors when she was a child consistent with stress  responses but particular etiological factors are unclear.  This level of endorsement of PTSD type symptoms, which were her highest elevations throughout the entire MMPI are of concern and I would suggest that these get addressed both with caution but more in-depth analysis.  The patient acknowledges considerable feelings of alienation from others and difficulties interpreting others motivation.  Depression features are primarily related to subjective symptoms of melancholy and feelings of helplessness and hopelessness.  The patient describes feelings of mental dullness and ruminative types of thought patterns.  She describes malaise and lack of motivation as well as somatic complaints.  Impression/Diagnosis:   Overall, the results of the current neuropsychological evaluation as well as psychological assessment are not consistent with patterns typically seen with adult residual attention deficit disorder.  The patient showed very good sustaining attention variables and the only issues with  regard to attention and concentration identified during objective assessment had to do with poor auditory encoding with excellent visual encoding components as well as increased distractibility and difficulties with freedom from distractibility initially but she was able to quickly adapt to ongoing distractions.  This pattern is not typically found with adult residual attention deficit disorder patterns.  Some of her auditory encoding issues may be related to difficulties in school and would not be typical of dyslexia but may have negative impacts on verbal comprehension and reading type academic pursuits.  The most salient feature of the current evaluation has to do with the patient's levels of depression, anxiety and intrusive obsessive types of thoughts.  While the patient is able to disengage from these types of negative thoughts rather quickly they do play a role in some of her issues.  Of most concern are the significant  elevation on both of her PTSD variables.  While the patient and her mother did not describe any particular events or traumatic experiences the patient does have a significant elevation on these variables on the MMPI.  I do think that this should be addressed more deeply.  The patient's mother reports that when the patient did take SSRI type medications previously that she noted a significant improvement although the patient has never been compliant with either SSRI medications or mood stabilizing medications.  While the patient's behavioral history would be consistent with an episodic mood disorder I think that the need to further assess the possibility of an underlying PTSD condition is warranted.  In any event, the primary focus should be on the patient's depression and anxiety and adjustment skills and we should readdress the patient's willingness to retry an SSRI medication and/or mood stabilizing medication.  The patient's negative feelings around taking these medications Are from being particularly compliant.  Both of these psychotropic approaches do require good compliance to achieve full effectiveness.  The patient also would likely benefit from psychotherapeutic interventions and therapy around her view of herself and building coping skills and strategies to better manage her interactions with day-to-day social interactions.  I will sit down with the patient and go over the results of the current neuropsychological evaluation.  Diagnosis:    Episodic mood disorder (HCC)  Chronic posttraumatic stress disorder  Depression and anxiety with significant need to rule out the possibility of her chronic underlying PTSD condition. _____________________ Ilean Skill, Psy.D. Clinical Neuropsychologist

## 2022-04-10 ENCOUNTER — Ambulatory Visit: Payer: 59 | Admitting: Psychology

## 2022-04-24 ENCOUNTER — Encounter: Payer: Self-pay | Admitting: Psychology

## 2022-04-24 ENCOUNTER — Encounter: Payer: 59 | Attending: Psychology | Admitting: Psychology

## 2022-04-24 DIAGNOSIS — F39 Unspecified mood [affective] disorder: Secondary | ICD-10-CM | POA: Diagnosis not present

## 2022-04-24 DIAGNOSIS — F4312 Post-traumatic stress disorder, chronic: Secondary | ICD-10-CM | POA: Diagnosis not present

## 2022-04-24 DIAGNOSIS — R41840 Attention and concentration deficit: Secondary | ICD-10-CM | POA: Insufficient documentation

## 2022-04-24 NOTE — Progress Notes (Signed)
04/24/2022 1 PM-2 PM:  Today's visit was an in person visit  Today my outpatient clinic office.  The patient, her mother and myself were present for this appointment.  I provided feedback regarding the results of the recent neuropsychological evaluation and we reviewed information pertinent to my concerns around the possibility of an underlying PTSD condition playing a role in her symptoms.  The patient's mood disturbance developed very early in life and they therefore be more reflective of traumatic type responses.  The patient is going to follow-up with her physician about trying an SSRI medication and I encouraged her to work on trying to find another therapist.  She had seen a therapist when she was a child that both her and her mother felt was helpful.  The patient has agreed to try an SSRI medication which she has been very hesitant to in the past.   Impression/Diagnosis:                     Overall, the results of the current neuropsychological evaluation as well as psychological assessment are not consistent with patterns typically seen with adult residual attention deficit disorder.  The patient showed very good sustaining attention variables and the only issues with regard to attention and concentration identified during objective assessment had to do with poor auditory encoding with excellent visual encoding components as well as increased distractibility and difficulties with freedom from distractibility initially but she was able to quickly adapt to ongoing distractions.  This pattern is not typically found with adult residual attention deficit disorder patterns.  Some of her auditory encoding issues may be related to difficulties in school and would not be typical of dyslexia but may have negative impacts on verbal comprehension and reading type academic pursuits.  The most salient feature of the current evaluation has to do with the patient's levels of depression, anxiety and intrusive  obsessive types of thoughts.  While the patient is able to disengage from these types of negative thoughts rather quickly they do play a role in some of her issues.  Of most concern are the significant elevation on both of her PTSD variables.  While the patient and her mother did not describe any particular events or traumatic experiences the patient does have a significant elevation on these variables on the MMPI.  I do think that this should be addressed more deeply.  The patient's mother reports that when the patient did take SSRI type medications previously that she noted a significant improvement although the patient has never been compliant with either SSRI medications or mood stabilizing medications.  While the patient's behavioral history would be consistent with an episodic mood disorder I think that the need to further assess the possibility of an underlying PTSD condition is warranted.  In any event, the primary focus should be on the patient's depression and anxiety and adjustment skills and we should readdress the patient's willingness to retry an SSRI medication and/or mood stabilizing medication.  The patient's negative feelings around taking these medications Are from being particularly compliant.  Both of these psychotropic approaches do require good compliance to achieve full effectiveness.  The patient also would likely benefit from psychotherapeutic interventions and therapy around her view of herself and building coping skills and strategies to better manage her interactions with day-to-day social interactions.  I will sit down with the patient and go over the results of the current neuropsychological evaluation.   Diagnosis:  Episodic mood disorder (HCC)   Chronic posttraumatic stress disorder   Depression and anxiety with significant need to rule out the possibility of her chronic underlying PTSD condition. _____________________ Arley Phenix,  Psy.D. Clinical Neuropsychologist

## 2023-06-15 DIAGNOSIS — Z6832 Body mass index (BMI) 32.0-32.9, adult: Secondary | ICD-10-CM

## 2023-06-15 DIAGNOSIS — E6609 Other obesity due to excess calories: Secondary | ICD-10-CM | POA: Insufficient documentation

## 2023-06-15 HISTORY — DX: Body mass index (BMI) 32.0-32.9, adult: Z68.32

## 2023-09-10 ENCOUNTER — Telehealth: Payer: Self-pay

## 2023-09-10 NOTE — Telephone Encounter (Signed)
Received incoming referral for the patient, the patient called in to schedule an appointment. I sent out an appointment reminder with a No-Show letter and a map. I verified and updated the information in her chart, including the insurance guarantor and other relevant details. The patient is aware of his $50 co-pay.

## 2023-09-16 NOTE — Progress Notes (Unsigned)
Celso Amy, PA-C [address]  [address]  [place], [place] [address]  Main: [phone]  Fax: [phone]   Gastroenterology Consultation  Referring Provider:     Marguarite Arbour, MD Primary Care Physician:  Marguarite Arbour, MD Primary Gastroenterologist:  Celso Amy, PA-C / Dr. Lannette Donath   Reason for Consultation:     Rectal bleeding, Constipation        HPI:   CARINA MEINHOLD is a 22 y.o. y/o female referred for consultation & management  by Marguarite Arbour, MD.  Patient states she has had intermittent rectal bleeding for many years.  She notices bright red blood in the toilet and on the tissue after bowel movements which has been worse for a few months.  She states the bleeding can be heavy at times like her menstrual cycle.  She struggles with chronic lifelong constipation since childhood.  She may have a bowel movement every 2 days or 2 weeks.  Can take 30 minutes to have bowel movement.  She has hard stools and straining.  Intermittent mid abdominal cramping.  She tried MiraLAX and did not like the texture.  Currently takes OTC stool softener as needed.  No consistent treatment for constipation.  She has not tried any prescriptions.  02/2023 Lab: Hgb 12.0g.  No Previous Colonoscopy.  01/2019 EGD by Pediatric GI at [facility] was repotedly normal.  03/2022: CT abd pelvis w/ contrast: Normal.  Past Medical History:  Diagnosis Date   Chronic headaches    Class 1 obesity due to excess calories without serious comorbidity with body mass index (BMI) of 32.0 to 32.9 in adult 06/15/2023   Depression    Painful menstrual periods     Past Surgical History:  Procedure Laterality Date   NO PAST SURGERIES      Prior to Admission medications   Medication Sig Start Date End Date Taking? Authorizing Provider  FLUoxetine (PROZAC) 20 MG capsule Take 20 mg by mouth daily. Patient not taking: Reported on 08/05/2021 07/09/20   [provider]  ibuprofen (ADVIL) 600  MG tablet Take 1 tablet (600 mg total) by mouth every 6 (six) hours as needed. Patient not taking: Reported on 08/05/2021 01/26/21   Domenick Gong, MD  lamoTRIgine (LAMICTAL) 150 MG tablet Take 1 tablet by mouth 2 (two) times daily. Patient not taking: Reported on 08/05/2021 07/12/21 07/12/22  [provider]  medroxyPROGESTERone (DEPO-PROVERA) 150 MG/ML injection Inject 1 mL (150 mg total) into the muscle every 3 (three) months. Patient not taking: Reported on 08/05/2021 12/26/18   Purcell Nails, CNM  norgestimate-ethinyl estradiol (ORTHO-CYCLEN) 0.25-35 MG-MCG tablet Take 1 tablet by mouth daily. 07/12/21 07/12/22  [provider]    Family History  Problem Relation Age of Onset   OCD Mother    Healthy Mother    Mental illness Mother    Bipolar disorder Father    Healthy Father    Breast cancer Neg Hx    Ovarian cancer Neg Hx    Colon cancer Neg Hx      Social History   Tobacco Use   Smoking status: Never   Smokeless tobacco: Never  Vaping Use   Vaping status: Never Used  Substance Use Topics   Alcohol use: No   Drug use: No    Allergies as of 09/17/2023   (No Known Allergies)    Review of Systems:    All systems reviewed and negative except where noted in HPI.  Physical Exam:  BP 123/85 (BP Location: Left Arm, Patient Position: Sitting, Cuff Size: Normal)   Pulse 71   Temp 98.2 F (36.8 C) (Oral)   Ht 5\' 2"  (1.575 m)   Wt 177 lb 2 oz (80.3 kg)   BMI 32.40 kg/m  No LMP recorded.  General:   Alert,  Well-developed, well-nourished, pleasant and cooperative in NAD Lungs:  Respirations even and unlabored.  Clear throughout to auscultation.   No wheezes, crackles, or rhonchi. No acute distress. Heart:  Regular rate and rhythm; no murmurs, clicks, rubs, or gallops. Abdomen:  Normal bowel sounds.  No bruits.  Soft, and non-distended without masses, hepatosplenomegaly or hernias noted.  No Tenderness.  No guarding or rebound tenderness.     Rectal:  No external hemorrhoids, rashes, fissures or lesions.  No internal masses or tenderness.  No gross blood.  Normal sphincter tone.  Imaging Studies: No results found.  Assessment and Plan:   ELIZABATH MASSEY is a 22 y.o. y/o female has been referred for:  Rectal Bleeding: Chronic for many years.  Differential includes internal hemorrhoid bleeding versus inflammatory bowel disease.  Normal rectal exam today.  No evidence of external hemorrhoids.  No anemia.  Scheduling Colonoscopy I discussed risks of colonoscopy with patient to include risk of bleeding, colon perforation, and risk of sedation.  Patient expressed understanding and agrees to proceed with colonoscopy.   2 Day Prep instructions given.  Chronic Constipation Gave samples of Linzess 72 mcg QD for 1 week, then 145 mcg QD for 1 week, then 290 mcg QD for 1 week.  She will let me know which dose works best, and then she can call me back for a prescription.  Recommend high fiber diet and drink 64 ounces fluids daily.  Follow up 4 weeks after Colonoscopy with TG.  Celso Amy, PA-C

## 2023-09-17 ENCOUNTER — Ambulatory Visit: Payer: 59 | Admitting: Physician Assistant

## 2023-09-17 ENCOUNTER — Encounter: Payer: Self-pay | Admitting: Physician Assistant

## 2023-09-17 ENCOUNTER — Telehealth: Payer: Self-pay | Admitting: Physician Assistant

## 2023-09-17 ENCOUNTER — Other Ambulatory Visit: Payer: Self-pay

## 2023-09-17 ENCOUNTER — Telehealth: Payer: Self-pay

## 2023-09-17 VITALS — BP 123/85 | HR 71 | Temp 98.2°F | Ht 62.0 in | Wt 177.1 lb

## 2023-09-17 DIAGNOSIS — K625 Hemorrhage of anus and rectum: Secondary | ICD-10-CM

## 2023-09-17 DIAGNOSIS — K5909 Other constipation: Secondary | ICD-10-CM

## 2023-09-17 DIAGNOSIS — K5904 Chronic idiopathic constipation: Secondary | ICD-10-CM

## 2023-09-17 MED ORDER — NA SULFATE-K SULFATE-MG SULF 17.5-3.13-1.6 GM/177ML PO SOLN: 354.0000 mL | Freq: Once | ORAL | 0 refills | Status: AC

## 2023-09-17 NOTE — Telephone Encounter (Signed)
The patient called and left a voicemail husband called in to reschedule her procedure. I called the patient back to inform him we received his message, and he stated she already spoke to the nurse.

## 2023-09-17 NOTE — Telephone Encounter (Signed)
Pt requesting call back to reschedule colonoscopy

## 2023-09-17 NOTE — Patient Instructions (Signed)
For Constipation Try: Samples of Linzess 72 mcg QD for 1 week,  then 145 mcg QD for 1 week,  then 290 mcg QD for 1 week.   Please let me know which dose works best, and then we can call in a prescription.

## 2023-09-20 ENCOUNTER — Other Ambulatory Visit: Payer: Self-pay

## 2023-09-20 MED ORDER — PEG 3350-KCL-NA BICARB-NACL 420 G PO SOLR: ORAL | 0 refills | Status: AC

## 2023-10-01 ENCOUNTER — Encounter: Payer: Self-pay | Admitting: Gastroenterology

## 2023-10-02 ENCOUNTER — Encounter: Payer: Self-pay | Admitting: Anesthesiology

## 2023-10-02 ENCOUNTER — Encounter: Payer: Self-pay | Admitting: Gastroenterology

## 2023-10-02 NOTE — Anesthesia Preprocedure Evaluation (Addendum)
Anesthesia Evaluation    Airway        Dental   Pulmonary           Cardiovascular      Neuro/Psych  Headaches PSYCHIATRIC DISORDERS  Depression       GI/Hepatic   Endo/Other    Renal/GU      Musculoskeletal   Abdominal   Peds  Hematology   Anesthesia Other Findings Painful menstrual periods  Depression Chronic headaches  Class 1 obesity due to excess calories without serious comorbidity with body mass index (BMI) of 32.0 to 32.9 in adult    Reproductive/Obstetrics                              Anesthesia Physical Anesthesia Plan Anesthesia Quick Evaluation

## 2023-10-04 ENCOUNTER — Ambulatory Visit: Payer: 59 | Admitting: Physician Assistant

## 2023-10-15 ENCOUNTER — Ambulatory Visit: Admission: RE | Admit: 2023-10-15 | Payer: 59 | Source: Home / Self Care | Admitting: Gastroenterology

## 2023-10-15 HISTORY — DX: Attention and concentration deficit: R41.840

## 2023-10-15 HISTORY — DX: Chronic idiopathic constipation: K59.04

## 2023-10-15 SURGERY — COLONOSCOPY WITH PROPOFOL
Anesthesia: General

## 2023-10-26 ENCOUNTER — Ambulatory Visit: Payer: 59 | Admitting: Physician Assistant

## 2023-10-30 ENCOUNTER — Ambulatory Visit: Payer: 59 | Admitting: Physician Assistant

## 2023-11-30 ENCOUNTER — Ambulatory Visit: Payer: 59 | Admitting: Physician Assistant
